# Patient Record
Sex: Male | Born: 1975 | Race: White | Hispanic: No | Smoking: Current every day smoker
Health system: Southern US, Community
[De-identification: ages and names within clinical notes are randomized; demographics above are authoritative.]

## PROBLEM LIST (undated history)

## (undated) ENCOUNTER — Telehealth

## (undated) ENCOUNTER — Encounter

## (undated) ENCOUNTER — Ambulatory Visit

## (undated) ENCOUNTER — Ambulatory Visit: Payer: MEDICARE

## (undated) ENCOUNTER — Encounter: Attending: Ambulatory Care | Primary: Ambulatory Care

## (undated) ENCOUNTER — Encounter: Attending: Family | Primary: Family

## (undated) ENCOUNTER — Encounter: Attending: Pharmacist | Primary: Pharmacist

## (undated) ENCOUNTER — Ambulatory Visit: Attending: Pharmacist | Primary: Pharmacist

## (undated) ENCOUNTER — Ambulatory Visit
Payer: PRIVATE HEALTH INSURANCE | Attending: Physical Medicine & Rehabilitation | Primary: Physical Medicine & Rehabilitation

## (undated) ENCOUNTER — Non-Acute Institutional Stay: Payer: PRIVATE HEALTH INSURANCE

## (undated) ENCOUNTER — Telehealth: Attending: Ambulatory Care | Primary: Ambulatory Care

## (undated) ENCOUNTER — Ambulatory Visit: Payer: PRIVATE HEALTH INSURANCE

## (undated) ENCOUNTER — Ambulatory Visit: Payer: PRIVATE HEALTH INSURANCE | Attending: Family | Primary: Family

## (undated) ENCOUNTER — Telehealth: Attending: Family | Primary: Family

## (undated) DIAGNOSIS — T07XXXA Unspecified multiple injuries, initial encounter: Secondary | ICD-10-CM

## (undated) DIAGNOSIS — M719 Bursopathy, unspecified: Secondary | ICD-10-CM

## (undated) HISTORY — PX: FRACTURE SURGERY: SHX138

## (undated) HISTORY — DX: Unspecified multiple injuries, initial encounter: T07.XXXA

---

## 2015-02-16 ENCOUNTER — Encounter (HOSPITAL_COMMUNITY): Payer: Self-pay | Admitting: *Deleted

## 2015-02-16 ENCOUNTER — Emergency Department (HOSPITAL_COMMUNITY)
Admission: EM | Admit: 2015-02-16 | Discharge: 2015-02-16 | Disposition: A | Discharge status: Home / Self Care | Payer: Self-pay | Attending: Emergency Medicine | Admitting: Emergency Medicine

## 2015-02-16 DIAGNOSIS — M62830 Muscle spasm of back: Secondary | ICD-10-CM | POA: Insufficient documentation

## 2015-02-16 DIAGNOSIS — Z720 Tobacco use: Secondary | ICD-10-CM | POA: Insufficient documentation

## 2015-02-16 MED ORDER — TRAMADOL HCL 50 MG PO TABS: 50.0000 mg | ORAL_TABLET | Freq: Four times a day (QID) | ORAL | Status: AC | PRN

## 2015-02-16 MED ORDER — CYCLOBENZAPRINE HCL 10 MG PO TABS: 10.0000 mg | ORAL_TABLET | Freq: Two times a day (BID) | ORAL | Status: DC | PRN

## 2015-02-16 NOTE — Discharge Instructions (Signed)
Continue to take aleve with the other medications. Do not drive or work while taking the muscle relaxant or narcotic because they will make you sleepy. Follow up with Dr. Romeo AppleHarrison if symptoms persist. Return here as needed.

## 2015-02-16 NOTE — ED Notes (Signed)
Pt co mid and lower back pain with muscle tightness on right side. Denies injury. Pain radiates rt leg.

## 2015-02-16 NOTE — ED Provider Notes (Signed)
CSN: [id number]     Arrival date & time 02/16/15  1551 History   First MD Initiated Contact with Patient 02/16/15 1623     Chief Complaint  Patient presents with  . Back Pain     (Consider location/radiation/quality/duration/timing/severity/associated sxs/prior Treatment) Patient is a 39 y.o. male presenting with back pain. The history is provided by the patient.  Back Pain Location:  Lumbar spine Quality: streatching, pressure. Radiates to:  R posterior upper leg Pain severity:  Moderate Pain is:  Worse during the day Onset quality:  Gradual Duration:  5 days Timing:  Constant Progression:  Worsening Chronicity:  New Context: lifting heavy objects and physical stress   Relieved by:  Nothing Worsened by:  Ambulation, bending, twisting and standing Ineffective treatments:  NSAIDs Associated symptoms: no bladder incontinence and no bowel incontinence    Paul Lynch is a 39 y.o. male who presents to the ED with low back pain and mid back pain on the right side that started 5 days ago. 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 He has not had any direct blow to the back.    History reviewed. No pertinent past medical history. History reviewed. No pertinent past surgical history. History reviewed. No pertinent family history. History  Substance Use Topics  . Smoking status: Current Every Day Smoker -- 0.50 packs/day    Types: Cigarettes  . Smokeless tobacco: Not on file  . Alcohol Use: Yes    Review of Systems  Gastrointestinal: Negative for bowel incontinence.  Genitourinary: Negative for bladder incontinence.  Musculoskeletal: Positive for back pain.  all other systems negative     Allergies  Review of patient's allergies indicates no known allergies.  Home Medications   Prior to Admission medications   Medication Sig Start Date End Date Taking? Authorizing Provider  cyclobenzaprine (FLEXERIL) 10 MG  tablet Take 1 tablet (10 mg total) by mouth 2 (two) times daily as needed for muscle spasms. 02/16/15   Tegh Franek Orlene OchM Aniston Christman, NP  traMADol (ULTRAM) 50 MG tablet Take 1 tablet (50 mg total) by mouth every 6 (six) hours as needed. 02/16/15   Mosie Angus Orlene OchM Macallister Ashmead, NP   BP 125/73 mmHg  Pulse 99  Temp(Src) 98.3 F (36.8 C) (Oral)  Resp 18  Ht 5\' 8"  (1.727 m)  Wt 190 lb (86.183 kg)  BMI 28.90 kg/m2  SpO2 98% Physical Exam  Constitutional: He is oriented to person, place, and time. He appears well-developed and well-nourished. No distress.  HENT:  Head: Normocephalic and atraumatic.  Eyes: EOM are normal. Pupils are equal, round, and reactive to light.  Neck: Normal range of motion. Neck supple.  Cardiovascular: Normal rate and regular rhythm.   Pulmonary/Chest: Effort normal. No respiratory distress. He has no wheezes. He has no rales.  Abdominal: Soft. Bowel sounds are normal. There is no tenderness.  Musculoskeletal: Normal range of motion. He exhibits no edema.       Lumbar back: He exhibits tenderness and spasm. He exhibits normal range of motion, no deformity and normal pulse.  There is tenderness to the thoracic area and the lumbar area on palpation and range of motion, spasm noted.   Neurological: He is alert and oriented to person, place, and time. He has normal strength. No cranial nerve deficit. Gait normal.  Reflex Scores:      Bicep reflexes are 2+ on the right side and 2+ on the left side.  Brachioradialis reflexes are 2+ on the right side and 2+ on the left side.      Patellar reflexes are 2+ on the right side and 2+ on the left side.      Achilles reflexes are 2+ on the right side and 2+ on the left side. Pedal pulses 2+, adequate circulation, ambulatory without foot drag.   Skin: Skin is warm and dry.  Psychiatric: He has a normal mood and affect. His behavior is normal.  Nursing note and vitals reviewed.   ED Course  Procedures  MDM  39 y.o. male with back pain and muscle spasm  stable for d/c without focal neuro deficits. Discussed with the patient clinical findings and plan of care. All questioned fully answered. He will follow up with ortho if symptoms persist or return here if any problems arise.   Final diagnoses:  Muscle spasm of back      [facility], NP 02/16/15 1709  Donnetta Hutching, MD 02/16/15 2321

## 2015-02-16 NOTE — ED Notes (Signed)
Pt made aware to return if symptoms worsen or if any life threatening symptoms occur.   

## 2015-02-19 ENCOUNTER — Encounter (HOSPITAL_COMMUNITY): Payer: Self-pay | Admitting: Emergency Medicine

## 2015-02-19 ENCOUNTER — Emergency Department (HOSPITAL_COMMUNITY)
Admission: EM | Admit: 2015-02-19 | Discharge: 2015-02-19 | Disposition: A | Discharge status: Home / Self Care | Payer: Self-pay | Attending: Emergency Medicine | Admitting: Emergency Medicine

## 2015-02-19 DIAGNOSIS — M62830 Muscle spasm of back: Secondary | ICD-10-CM | POA: Insufficient documentation

## 2015-02-19 DIAGNOSIS — M549 Dorsalgia, unspecified: Secondary | ICD-10-CM

## 2015-02-19 DIAGNOSIS — Z720 Tobacco use: Secondary | ICD-10-CM | POA: Insufficient documentation

## 2015-02-19 NOTE — Discharge Instructions (Signed)
Your vital signs are nonacute at this time. Your examination is consistent with muscle spasm and back pain. Please see the orthopedic specialist listed above, or your chiropractic specialist that she was seen in the past for additional evaluation and management. Please return to the emergency department if any emergent changes, problems, or concerns. Musculoskeletal Pain Musculoskeletal pain is muscle and boney aches and pains. These pains can occur in any part of the body. Your caregiver may treat you without knowing the cause of the pain. They may treat you if blood or urine tests, X-rays, and other tests were normal.  CAUSES There is often not a definite cause or reason for these pains. These pains may be caused by a type of germ (virus). The discomfort may also come from overuse. Overuse includes working out too hard when your body is not fit. Boney aches also come from weather changes. Bone is sensitive to atmospheric pressure changes. HOME CARE INSTRUCTIONS   Ask when your test results will be ready. Make sure you get your test results.  Only take over-the-counter or prescription medicines for pain, discomfort, or fever as directed by your caregiver. If you were given medications for your condition, do not drive, operate machinery or power tools, or sign legal documents for 24 hours. Do not drink alcohol. Do not take sleeping pills or other medications that may interfere with treatment.  Continue all activities unless the activities cause more pain. When the pain lessens, slowly resume normal activities. Gradually increase the intensity and duration of the activities or exercise.  During periods of severe pain, bed rest may be helpful. Lay or sit in any position that is comfortable.  Putting ice on the injured area.  Put ice in a bag.  Place a towel between your skin and the bag.  Leave the ice on for 15 to 20 minutes, 3 to 4 times a day.  Follow up with your caregiver for continued  problems and no reason can be found for the pain. If the pain becomes worse or does not go away, it may be necessary to repeat tests or do additional testing. Your caregiver may need to look further for a possible cause. SEEK IMMEDIATE MEDICAL CARE IF:  You have pain that is getting worse and is not relieved by medications.  You develop chest pain that is associated with shortness or breath, sweating, feeling sick to your stomach (nauseous), or throw up (vomit).  Your pain becomes localized to the abdomen.  You develop any new symptoms that seem different or that concern you. MAKE SURE YOU:   Understand these instructions.  Will watch your condition.  Will get help right away if you are not doing well or get worse. Document Released: 09/08/2005 Document Revised: 12/01/2011 Document Reviewed: 05/13/2013 [facility] Patient Information 2015 [place], [place]. This information is not intended to replace advice given to you by your health care provider. Make sure you discuss any questions you have with your health care provider.

## 2015-02-19 NOTE — ED Notes (Signed)
Pt states that he continues to have back pain since his visit on Friday.  States that he did not get the rx for muscle relaxers or pain meds filled and has been taking ibuprofen without relief.

## 2015-02-19 NOTE — ED Provider Notes (Signed)
CSN: [id number]     Arrival date & time 02/19/15  1242 History  This chart was scribed for non-physician practitioner, Ivery QualeHobson Laniya Friedl, PA-C working with Samuel JesterKathleen McManus, DO by Gwenyth Oberatherine Macek, ED scribe. This patient was seen in room APFT23/APFT23 and the patient's care was started at 1:46 PM   Chief Complaint  Patient presents with  . Back Pain   Patient is a 39 y.o. male presenting with back pain. The history is provided by the patient. No language interpreter was used.  Back Pain Location:  Thoracic spine, lumbar spine and gluteal region Pain severity:  Moderate Worse during: worse in the morning. Onset quality:  Gradual Duration:  1 week Timing:  Constant Progression:  Unchanged Chronicity:  New Context: not recent injury   Relieved by:  Nothing Worsened by:  Lying down Ineffective treatments:  Ibuprofen, NSAIDs and cold packs Associated symptoms: no numbness    HPI Comments: Paul Lynch is a 39 y.o. male who presents to the Emergency Department complaining of constant, moderate, jabbing lower and middle back pain that started 1 week ago. He reports swelling of his back as an associated symptom. Pt states pain is worst in the morning and is exacerbated by lying down. He has tried Ibuprofen, Aleve and ice with no relief. Pt was seen in the ED on 5/27 for the same and was diagnosed with a muscle spasm. He was prescribed Ultram and Flexeril, but has not filled medications because of financial difficulties. Pt has a history of a bulging disk which was treated by a chiropractor. He also notes that he does a lot of heavy lifting at work. Pt denies recent injuries. He also denies numbness as an associated symptom.  History reviewed. No pertinent past medical history. Past Surgical History  Procedure Laterality Date  . Fracture surgery     History reviewed. No pertinent family history. History  Substance Use Topics  . Smoking status: Current Every Day Smoker -- 0.50 packs/day    Types:  Cigarettes  . Smokeless tobacco: Not on file  . Alcohol Use: Yes     Comment: occasional    Review of Systems  Musculoskeletal: Positive for back pain.  Neurological: Negative for numbness.  All other systems reviewed and are negative.  Allergies  Review of patient's allergies indicates no known allergies.  Home Medications   Prior to Admission medications   Medication Sig Start Date End Date Taking? Authorizing Provider  cyclobenzaprine (FLEXERIL) 10 MG tablet Take 1 tablet (10 mg total) by mouth 2 (two) times daily as needed for muscle spasms. 02/16/15   Hope Orlene OchM Neese, NP  traMADol (ULTRAM) 50 MG tablet Take 1 tablet (50 mg total) by mouth every 6 (six) hours as needed. 02/16/15   Hope Orlene OchM Neese, NP   BP 127/73 mmHg  Pulse 103  Temp(Src) 98.6 F (37 C) (Oral)  Resp 20  Ht 5\' 8"  (1.727 m)  Wt 180 lb (81.647 kg)  BMI 27.38 kg/m2  SpO2 99% Physical Exam  Constitutional: He appears well-developed and well-nourished. No distress.  HENT:  Head: Normocephalic and atraumatic.  Eyes: Conjunctivae and EOM are normal.  Neck: Neck supple. No tracheal deviation present.  Cardiovascular: Normal rate, regular rhythm and normal heart sounds.   Pulmonary/Chest: Effort normal and breath sounds normal. No respiratory distress.  No bruit  Musculoskeletal:       Thoracic back: He exhibits tenderness and spasm. He exhibits no deformity.       Lumbar back: He exhibits spasm. He  exhibits no deformity.  Neurological: Gait normal.  No foot drop; no muscle or motor deficits of the LE  Skin: Skin is warm and dry.  Psychiatric: He has a normal mood and affect. His behavior is normal.  Nursing note and vitals reviewed.   ED Course  Procedures   DIAGNOSTIC STUDIES: Oxygen Saturation is 99% on RA, normal by my interpretation.    COORDINATION OF CARE: 1:58 PM Discussed treatment plan with pt at bedside and pt agreed to plan. Will refer to orthopedist for continued pain.   Labs Review Labs  Reviewed - No data to display  Imaging Review No results found.   EKG Interpretation None      MDM  Vital signs are nonacute today. Patient was seen in the emergency department 3 or 4 days ago with similar problem. He presents to the emergency department today because he had pain getting out of bed, and he states he felt as though that there was something sticking in his back and keeping him from moving this morning when he attempted to go to work. There was no loss of bowel or bladder function.  Examination today is negative for any caudal equina. There is palpable spasm of the back multiple areas. There no gross neurologic deficits appreciated. There no hot areas consistent with any infection or discitis.  I discussed with the patient the need for orthopedic evaluation. The patient states that he had excellent result with a previous disc related problems from a chiropractic physician, and I have encouraged him to see the chiropractic physician if he does not see the orthopedic specialist. The patient states that he does not like to use narcotic medications and that is why he did not get his Flexeril or his Ultram filled, as well as a financial situation going on. The patient elects to use the ibuprofen and Tylenol. He states he will also alternate heat and ice. The patient is encouraged to see the specialist as listed above. He is also encouraged to return if any emergent changes, problems, or concerns.    Final diagnoses:  None    **I have reviewed nursing notes, vital signs, and all appropriate lab and imaging results for this patient.*  **I personally performed the services described in this documentation, which was scribed in my presence. The recorded information has been reviewed and is accurate.Ivery Quale, PA-C 02/19/15 1412  Samuel Jester, DO 02/22/15 2008

## 2015-04-14 DIAGNOSIS — Y288XXA Contact with other sharp object, undetermined intent, initial encounter: Secondary | ICD-10-CM | POA: Insufficient documentation

## 2015-04-14 DIAGNOSIS — Z720 Tobacco use: Secondary | ICD-10-CM | POA: Insufficient documentation

## 2015-04-14 DIAGNOSIS — S63501A Unspecified sprain of right wrist, initial encounter: Secondary | ICD-10-CM | POA: Insufficient documentation

## 2015-04-14 DIAGNOSIS — S61210A Laceration without foreign body of right index finger without damage to nail, initial encounter: Secondary | ICD-10-CM | POA: Insufficient documentation

## 2015-04-14 DIAGNOSIS — S53401A Unspecified sprain of right elbow, initial encounter: Secondary | ICD-10-CM | POA: Insufficient documentation

## 2015-04-14 DIAGNOSIS — Y9389 Activity, other specified: Secondary | ICD-10-CM | POA: Insufficient documentation

## 2015-04-14 DIAGNOSIS — Y998 Other external cause status: Secondary | ICD-10-CM | POA: Insufficient documentation

## 2015-04-14 DIAGNOSIS — Y9289 Other specified places as the place of occurrence of the external cause: Secondary | ICD-10-CM | POA: Insufficient documentation

## 2015-04-15 ENCOUNTER — Encounter (HOSPITAL_COMMUNITY): Payer: Self-pay | Admitting: Emergency Medicine

## 2015-04-15 ENCOUNTER — Emergency Department (HOSPITAL_COMMUNITY): Payer: Self-pay

## 2015-04-15 ENCOUNTER — Emergency Department (HOSPITAL_COMMUNITY)
Admission: EM | Admit: 2015-04-15 | Discharge: 2015-04-15 | Disposition: A | Discharge status: Home / Self Care | Payer: Self-pay | Attending: Emergency Medicine | Admitting: Emergency Medicine

## 2015-04-15 DIAGNOSIS — R52 Pain, unspecified: Secondary | ICD-10-CM

## 2015-04-15 DIAGNOSIS — S63501A Unspecified sprain of right wrist, initial encounter: Secondary | ICD-10-CM

## 2015-04-15 DIAGNOSIS — S61218A Laceration without foreign body of other finger without damage to nail, initial encounter: Secondary | ICD-10-CM

## 2015-04-15 DIAGNOSIS — S53401A Unspecified sprain of right elbow, initial encounter: Secondary | ICD-10-CM

## 2015-04-15 MED ORDER — KETOROLAC TROMETHAMINE 10 MG PO TABS
10.0000 mg | ORAL_TABLET | Freq: Once | ORAL | Status: AC
  Administered 2015-04-15: 10 mg via ORAL
  Filled 2015-04-15: qty 1

## 2015-04-15 NOTE — ED Notes (Signed)
Patient reports his hand was caught in a slide this evening and twisted his arm behind him. complaining of pain to right elbow and right thumb. Also reports swelling to right wrist, but denies pain.

## 2015-04-15 NOTE — ED Notes (Signed)
Patient verbalizes understanding of discharge instructions, home care and follow up care. Patient ambulatory out of department at this time. 

## 2015-04-15 NOTE — ED Provider Notes (Signed)
CSN: [id number]     Arrival date & time 04/14/15  2349 History   First MD Initiated Contact with Patient 04/15/15 0043     Chief Complaint  Patient presents with  . Hand Injury     (Consider location/radiation/quality/duration/timing/severity/associated sxs/prior Treatment) Patient is a 39 y.o. male presenting with hand injury. The history is provided by the patient.  Hand Injury Location:  Elbow, finger and wrist Time since incident:  5 hours Injury: yes   Mechanism of injury comment:  Pt got had  his arm stuck in a water slide Elbow location:  R elbow Wrist location:  R wrist Finger location:  R thumb Pain details:    Quality:  Aching   Severity:  Moderate   Onset quality:  Sudden   Duration:  5 hours   Timing:  Intermittent   Progression:  Worsening Chronicity:  New Handedness:  Right-handed Dislocation: no   Foreign body present:  No foreign bodies Prior injury to area:  Yes Worsened by:  Nothing tried Ineffective treatments:  Rest and being still Associated symptoms: decreased range of motion   Associated symptoms: no back pain and no neck pain     History reviewed. No pertinent past medical history. Past Surgical History  Procedure Laterality Date  . Fracture surgery     History reviewed. No pertinent family history. History  Substance Use Topics  . Smoking status: Current Every Day Smoker -- 0.50 packs/day    Types: Cigarettes  . Smokeless tobacco: Not on file  . Alcohol Use: Yes     Comment: occasional    Review of Systems  Constitutional: Negative for activity change.       All ROS Neg except as noted in HPI  HENT: Negative for nosebleeds.   Eyes: Negative for photophobia and discharge.  Respiratory: Negative for cough, shortness of breath and wheezing.   Cardiovascular: Negative for chest pain and palpitations.  Gastrointestinal: Negative for abdominal pain and blood in stool.  Genitourinary: Negative for dysuria, frequency and hematuria.   Musculoskeletal: Negative for back pain, arthralgias and neck pain.  Skin: Negative.   Neurological: Negative for dizziness, seizures and speech difficulty.  Psychiatric/Behavioral: Negative for hallucinations and confusion.      Allergies  Review of patient's allergies indicates no known allergies.  Home Medications   Prior to Admission medications   Medication Sig Start Date End Date Taking? Authorizing Provider  cyclobenzaprine (FLEXERIL) 10 MG tablet Take 1 tablet (10 mg total) by mouth 2 (two) times daily as needed for muscle spasms. Patient not taking: Reported on 02/19/2015 02/16/15   Janne Napoleon, NP  traMADol (ULTRAM) 50 MG tablet Take 1 tablet (50 mg total) by mouth every 6 (six) hours as needed. Patient not taking: Reported on 02/19/2015 02/16/15   Janne Napoleon, NP   BP 135/76 mmHg  Pulse 82  Temp(Src) 99 F (37.2 C) (Oral)  Resp 18  Ht  (1.702 m)  Wt 176 lb (79.833 kg)  BMI 27.56 kg/m2  SpO2 100% Physical Exam  Constitutional: He is oriented to person, place, and time. He appears well-developed and well-nourished.  Non-toxic appearance.  HENT:  Head: Normocephalic.  Right Ear: Tympanic membrane and external ear normal.  Left Ear: Tympanic membrane and external ear normal.  Eyes: EOM and lids are normal. Pupils are equal, round, and reactive to light.  Neck: Normal range of motion. Neck supple. Carotid bruit is not present.  Cardiovascular: Normal rate, regular rhythm, normal heart sounds, intact  distal pulses and normal pulses.   Pulmonary/Chest: Breath sounds normal. No respiratory distress.  Abdominal: Soft. Bowel sounds are normal. There is no tenderness. There is no guarding.  Musculoskeletal: He exhibits tenderness.  There is a laceration that has begun to seal on the lateral and medial aspect of the right index finger. There is full range of motion of the right index finger. There is pain at the DIP joint of the right thumb. Mild swelling present. Is good  range of motion of the thumb. Patient can touch each finger with his thumb.  There is no deformity of the wrist. There is no pain in the anatomical snuffbox. There is no deformity of the radius or ulnar. There is pain to the olecranon process of the right elbow, there is pain at the lower triceps area of the right elbow. There is soreness of the bicep area. There is no palpable hematoma or deformity. There is full range of motion of the right shoulder. No evidence of any dislocation. No tenderness over the acromioclavicular joint.  Lymphadenopathy:       Head (right side): No submandibular adenopathy present.       Head (left side): No submandibular adenopathy present.    He has no cervical adenopathy.  Neurological: He is alert and oriented to person, place, and time. He has normal strength. No cranial nerve deficit or sensory deficit.  Skin: Skin is warm and dry.  Psychiatric: He has a normal mood and affect. His speech is normal.  Nursing note and vitals reviewed.   ED Course  Procedures (including critical care time) Labs Review Labs Reviewed - No data to display  Imaging Review No results found.   EKG Interpretation None      MDM  Vital signs reviewed.  Xray of the right wrist  And elbow are negative for fracture or dislocation. Xray of the right thumb is negative for fx or dislocation. Pt fitted with sling. Pt refused wrist splint. Suspect shoulder strain and finger sprain. Pt to see orthopedics for eval if not improving.   Final diagnoses:  Pain    **I have reviewed nursing notes, vital signs, and all appropriate lab and imaging results for this patient.Paul Quale, PA-C 04/16/15 1657  Devoria Albe, MD 04/19/15 [phone]

## 2015-04-15 NOTE — Discharge Instructions (Signed)
The x-rays of your elbow, wrist, and thumb are all negative for fracture, or dislocation. No evidence at this time of a major lead or tear in the upper extremity. Please use the sling for the next 4 or 5 days. Please use Tylenol or ibuprofen for soreness. Please use your wrist splint for the next 4 or 5 days. Please see Dr. Romeo Apple for additional evaluation and management if not improving. Wrist Pain Wrist injuries are frequent in adults and children. A sprain is an injury to the ligaments that hold your bones together. A strain is an injury to muscle or muscle cord-like structures (tendons) from stretching or pulling. Generally, when wrists are moderately tender to touch following a fall or injury, a break in the bone (fracture) may be present. Most wrist sprains or strains are better in 3 to 5 days, but complete healing may take several weeks. HOME CARE INSTRUCTIONS   Put ice on the injured area.  Put ice in a plastic bag.  Place a towel between your skin and the bag.  Leave the ice on for 15-20 minutes, 3-4 times a day, for the first 2 days, or as directed by your health care provider.  Keep your arm raised above the level of your heart whenever possible to reduce swelling and pain.  Rest the injured area for at least 48 hours or as directed by your health care provider.  If a splint or elastic bandage has been applied, use it for as long as directed by your health care provider or until seen by a health care provider for a follow-up exam.  Only take over-the-counter or prescription medicines for pain, discomfort, or fever as directed by your health care provider.  Keep all follow-up appointments. You may need to follow up with a specialist or have follow-up X-rays. Improvement in pain level is not a guarantee that you did not fracture a bone in your wrist. The only way to determine whether or not you have a broken bone is by X-ray. SEEK IMMEDIATE MEDICAL CARE IF:   Your fingers are  swollen, very red, white, or cold and blue.  Your fingers are numb or tingling.  You have increasing pain.  You have difficulty moving your fingers. MAKE SURE YOU:   Understand these instructions.  Will watch your condition.  Will get help right away if you are not doing well or get worse. Document Released: 06/18/2005 Document Revised: 09/13/2013 Document Reviewed: 10/30/2010 [facility] Patient Information 2015 [place], [place]. This information is not intended to replace advice given to you by your health care provider. Make sure you discuss any questions you have with your health care provider.

## 2015-09-27 ENCOUNTER — Encounter (HOSPITAL_COMMUNITY): Payer: Self-pay

## 2015-09-27 ENCOUNTER — Emergency Department (HOSPITAL_COMMUNITY)
Admission: EM | Admit: 2015-09-27 | Discharge: 2015-09-27 | Disposition: A | Discharge status: Home / Self Care | Payer: Self-pay | Attending: Emergency Medicine | Admitting: Emergency Medicine

## 2015-09-27 DIAGNOSIS — J019 Acute sinusitis, unspecified: Secondary | ICD-10-CM | POA: Insufficient documentation

## 2015-09-27 DIAGNOSIS — F17210 Nicotine dependence, cigarettes, uncomplicated: Secondary | ICD-10-CM | POA: Insufficient documentation

## 2015-09-27 MED ORDER — AMOXICILLIN 500 MG PO CAPS: 500.0000 mg | ORAL_CAPSULE | Freq: Three times a day (TID) | ORAL | Status: AC

## 2015-09-27 NOTE — Discharge Instructions (Signed)
Sinusitis, Adult  Sinusitis is redness, soreness, and puffiness (inflammation) of the air pockets in the bones of your face (sinuses). The redness, soreness, and puffiness can cause air and mucus to get trapped in your sinuses. This can allow germs to grow and cause an infection.   HOME CARE    Drink enough fluids to keep your pee (urine) clear or pale yellow.   Use a humidifier in your home.   Run a hot shower to create steam in the bathroom. Sit in the bathroom with the door closed. Breathe in the steam 3-4 times a day.   Put a warm, moist washcloth on your face 3-4 times a day, or as told by your doctor.   Use salt water sprays (saline sprays) to wet the thick fluid in your nose. This can help the sinuses drain.   Only take medicine as told by your doctor.  GET HELP RIGHT AWAY IF:    Your pain gets worse.   You have very bad headaches.   You are sick to your stomach (nauseous).   You throw up (vomit).   You are very sleepy (drowsy) all the time.   Your face is puffy (swollen).   Your vision changes.   You have a stiff neck.   You have trouble breathing.  MAKE SURE YOU:    Understand these instructions.   Will watch your condition.   Will get help right away if you are not doing well or get worse.     This information is not intended to replace advice given to you by your health care provider. Make sure you discuss any questions you have with your health care provider.     Document Released: 02/25/2008 Document Revised: 09/29/2014 Document Reviewed: 04/13/2012  Elsevier Interactive Patient Education 2016 [employer]

## 2015-09-27 NOTE — ED Notes (Signed)
Pt reports runny nose for the past 2 days and this morning woke up with severe sinus pressure.

## 2015-09-30 NOTE — ED Provider Notes (Signed)
CSN: [id number]     Arrival date & time 09/27/15  1725 History   First MD Initiated Contact with Patient 09/27/15 1834     Chief Complaint  Patient presents with  . Facial Pain     (Consider location/radiation/quality/duration/timing/severity/associated sxs/prior Treatment) HPI  Paul Lynch is a 40 y.o. male who presents to the Emergency Department complaining of runny nose and facial pressure and pain for 2 days.  He reports increased pain behind his eyes and across the forehead.  Pain increases with bending over.  He states he has been blowing colored mucus from his nose.  He states his symptoms feel similar to previous sinus infections.  He has used OTC cold medications without relief.     History reviewed. No pertinent past medical history. Past Surgical History  Procedure Laterality Date  . Fracture surgery     No family history on file. Social History  Substance Use Topics  . Smoking status: Current Every Day Smoker -- 0.50 packs/day    Types: Cigarettes  . Smokeless tobacco: None  . Alcohol Use: Yes     Comment: occasional    Review of Systems  Constitutional: Negative for fever, chills, activity change and appetite change.  HENT: Positive for congestion, rhinorrhea and sinus pressure. Negative for facial swelling, sore throat and trouble swallowing.   Eyes: Negative for visual disturbance.  Respiratory: Positive for cough. Negative for shortness of breath, wheezing and stridor.   Gastrointestinal: Negative for nausea, vomiting and abdominal pain.  Musculoskeletal: Negative for neck pain and neck stiffness.  Skin: Negative.  Negative for rash.  Neurological: Positive for headaches. Negative for dizziness, weakness and numbness.  Hematological: Negative for adenopathy.  Psychiatric/Behavioral: Negative for confusion.  All other systems reviewed and are negative.     Allergies  Review of patient's allergies indicates no known allergies.  Home Medications   Prior  to Admission medications   Medication Sig Start Date End Date Taking? Authorizing Provider  amoxicillin (AMOXIL) 500 MG capsule Take 1 capsule (500 mg total) by mouth 3 (three) times daily. For 10 days 09/27/15   Melek Pownall, PA-C  cyclobenzaprine (FLEXERIL) 10 MG tablet Take 1 tablet (10 mg total) by mouth 2 (two) times daily as needed for muscle spasms. Patient not taking: Reported on 02/19/2015 02/16/15   Janne Napoleon, NP  traMADol (ULTRAM) 50 MG tablet Take 1 tablet (50 mg total) by mouth every 6 (six) hours as needed. Patient not taking: Reported on 02/19/2015 02/16/15   Janne Napoleon, NP   BP 135/72 mmHg  Pulse 89  Temp(Src) 98.7 F (37.1 C) (Oral)  Resp 14  SpO2 99% Physical Exam  Constitutional: He is oriented to person, place, and time. He appears well-developed and well-nourished. No distress.  HENT:  Head: Normocephalic and atraumatic.  Right Ear: Tympanic membrane and ear canal normal.  Left Ear: Tympanic membrane and ear canal normal.  Nose: Right sinus exhibits maxillary sinus tenderness. Left sinus exhibits maxillary sinus tenderness.  Mouth/Throat: Uvula is midline, oropharynx is clear and moist and mucous membranes are normal.  Eyes: Conjunctivae and EOM are normal. Pupils are equal, round, and reactive to light.  Neck: Normal range of motion. Neck supple.  Cardiovascular: Normal rate and regular rhythm.   Pulmonary/Chest: Effort normal and breath sounds normal. No respiratory distress.  Musculoskeletal: Normal range of motion.  Neurological: He is alert and oriented to person, place, and time.  Skin: Skin is warm and dry. No rash noted.  Psychiatric: He  has a normal mood and affect.  Nursing note and vitals reviewed.   ED Course  Procedures (including critical care time) Labs Review Labs Reviewed - No data to display  Imaging Review No results found. I have personally reviewed and evaluated these images and lab results as part of my medical decision-making.    EKG Interpretation None      MDM   Final diagnoses:  Acute sinusitis, recurrence not specified, unspecified location    Pt non-toxic appearing.  Vitals stable.  Facial pain likely related to sinusitis.  No dental pain or facial swelling.  Airway patent.  Pt also advised to use OTC afrin spray, tylenol and motrin if needed.  He agrees to close PMD f/u.      Pauline Ausammy Phoenix Dresser, PA-C 09/30/15 1905  Vanetta MuldersScott Zackowski, MD 10/03/15 [phone]

## 2015-12-25 ENCOUNTER — Emergency Department (HOSPITAL_COMMUNITY)
Admission: EM | Admit: 2015-12-25 | Discharge: 2015-12-25 | Disposition: A | Discharge status: Home / Self Care | Payer: Self-pay | Attending: Emergency Medicine | Admitting: Emergency Medicine

## 2015-12-25 ENCOUNTER — Encounter (HOSPITAL_COMMUNITY): Payer: Self-pay | Admitting: Emergency Medicine

## 2015-12-25 DIAGNOSIS — F17210 Nicotine dependence, cigarettes, uncomplicated: Secondary | ICD-10-CM | POA: Insufficient documentation

## 2015-12-25 DIAGNOSIS — Z202 Contact with and (suspected) exposure to infections with a predominantly sexual mode of transmission: Secondary | ICD-10-CM

## 2015-12-25 DIAGNOSIS — A64 Unspecified sexually transmitted disease: Secondary | ICD-10-CM | POA: Insufficient documentation

## 2015-12-25 LAB — URINALYSIS, ROUTINE W REFLEX MICROSCOPIC
Bilirubin Urine: NEGATIVE
GLUCOSE, UA: NEGATIVE mg/dL
Hgb urine dipstick: NEGATIVE
KETONES UR: NEGATIVE mg/dL
Leukocytes, UA: NEGATIVE
Nitrite: NEGATIVE
PROTEIN: NEGATIVE mg/dL
Specific Gravity, Urine: 1.02 (ref 1.005–1.030)
pH: 6 (ref 5.0–8.0)

## 2015-12-25 NOTE — Discharge Instructions (Signed)
Someone from the flow managers office will inform you if any of your test are abnormal. Your vital signs are nonacute. It is important that you exercise adequate time protection when pertussis abating and sexual activity. Safe Sex Safe sex is about reducing the risk of giving or getting a sexually transmitted disease (STD). STDs are spread through sexual contact involving the genitals, mouth, or rectum. Some STDs can be cured and others cannot. Safe sex can also prevent unintended pregnancies.  WHAT ARE SOME SAFE SEX PRACTICES?  Limit your sexual activity to only one partner who is having sex with only you.  Talk to your partner about his or her past partners, past STDs, and drug use.  Use a condom every time you have sexual intercourse. This includes vaginal, oral, and anal sexual activity. Both females and males should wear condoms during oral sex. Only use latex or polyurethane condoms and water-based lubricants. Using petroleum-based lubricants or oils to lubricate a condom will weaken the condom and increase the chance that it will break. The condom should be in place from the beginning to the end of sexual activity. Wearing a condom reduces, but does not completely eliminate, your risk of getting or giving an STD. STDs can be spread by contact with infected body fluids and skin.  Get vaccinated for hepatitis B and HPV.  Avoid alcohol and recreational drugs, which can affect your judgment. You may forget to use a condom or participate in high-risk sex.  For females, avoid douching after sexual intercourse. Douching can spread an infection farther into the reproductive tract.  Check your body for signs of sores, blisters, rashes, or unusual discharge. See your health care provider if you notice any of these signs.  Avoid sexual contact if you have symptoms of an infection or are being treated for an STD. If you or your partner has herpes, avoid sexual contact when blisters are present. Use  condoms at all other times.  If you are at risk of being infected with HIV, it is recommended that you take a prescription medicine daily to prevent HIV infection. This is called pre-exposure prophylaxis (PrEP). You are considered at risk if:  You are a man who has sex with other men (MSM).  You are a heterosexual man or woman who is sexually active with more than one partner.  You take drugs by injection.  You are sexually active with a partner who has HIV.  Talk with your health care provider about whether you are at high risk of being infected with HIV. If you choose to begin PrEP, you should first be tested for HIV. You should then be tested every 3 months for as long as you are taking PrEP.  See your health care provider for regular screenings, exams, and tests for other STDs. Before having sex with a new partner, each of you should be screened for STDs and should talk about the results with each other. WHAT ARE THE BENEFITS OF SAFE SEX?   There is less chance of getting or giving an STD.  You can prevent unwanted or unintended pregnancies.  By discussing safe sex concerns with your partner, you may increase feelings of intimacy, comfort, trust, and honesty between the two of you.   This information is not intended to replace advice given to you by your health care provider. Make sure you discuss any questions you have with your health care provider.   Document Released: 10/16/2004 Document Revised: 09/29/2014 Document Reviewed: 03/01/2012 Elsevier Interactive  Patient Education 2016 [employer].

## 2015-12-25 NOTE — ED Provider Notes (Signed)
CSN: [id number]     Arrival date & time 12/25/15  1804 History   First MD Initiated Contact with Patient 12/25/15 1825     Chief Complaint  Patient presents with  . SEXUALLY TRANSMITTED DISEASE     (Consider location/radiation/quality/duration/timing/severity/associated sxs/prior Treatment) HPI Comments: Patient is a 40 year old male who presents to the emergency department with a complaint of exposure to sexually transmitted disease. The patient states that about 3 weeks ago he had unprotected intercourse with a lady. He states that since that time the sexual partner has called him to say that she has been found positive for some sexually transmitted diseases. The patient states he has not had any symptoms. In particular he has not had any dysuria. He's not had any unusual rash. He's not had any fever reported. He states that he is quite anxious, and sometimes he has itching related to anxiety, but no other problems reported. He denies any pain of the penis, scrotum, or peritoneum. He presents now for evaluation of following contact with possible sexual transmitted disease.  The history is provided by the patient.    History reviewed. No pertinent past medical history. Past Surgical History  Procedure Laterality Date  . Fracture surgery     History reviewed. No pertinent family history. Social History  Substance Use Topics  . Smoking status: Current Every Day Smoker -- 0.50 packs/day    Types: Cigarettes  . Smokeless tobacco: None  . Alcohol Use: Yes     Comment: occasional    Review of Systems  Constitutional: Negative for activity change.       All ROS Neg except as noted in HPI  HENT: Negative for nosebleeds.   Eyes: Negative for photophobia and discharge.  Respiratory: Negative for cough, shortness of breath and wheezing.   Cardiovascular: Negative for chest pain and palpitations.  Gastrointestinal: Negative for abdominal pain and blood in stool.  Genitourinary: Negative for  dysuria, frequency, hematuria, discharge, penile swelling, difficulty urinating, genital sores and testicular pain.  Musculoskeletal: Negative for back pain, arthralgias and neck pain.  Skin: Negative.   Neurological: Negative for dizziness, seizures and speech difficulty.  Psychiatric/Behavioral: Negative for hallucinations and confusion.      Allergies  Review of patient's allergies indicates no known allergies.  Home Medications   Prior to Admission medications   Medication Sig Start Date End Date Taking? Authorizing Provider  amoxicillin (AMOXIL) 500 MG capsule Take 1 capsule (500 mg total) by mouth 3 (three) times daily. For 10 days 09/27/15   Tammy Triplett, PA-C  cyclobenzaprine (FLEXERIL) 10 MG tablet Take 1 tablet (10 mg total) by mouth 2 (two) times daily as needed for muscle spasms. Patient not taking: Reported on 02/19/2015 02/16/15   Janne NapoleonHope M Neese, NP  traMADol (ULTRAM) 50 MG tablet Take 1 tablet (50 mg total) by mouth every 6 (six) hours as needed. Patient not taking: Reported on 02/19/2015 02/16/15   Janne NapoleonHope M Neese, NP   BP 145/76 mmHg  Pulse 111  Temp(Src) 98.5 F (36.9 C) (Oral)  Resp 18  Ht 5\' 8"  (1.727 m)  Wt 81.647 kg  BMI 27.38 kg/m2  SpO2 98% Physical Exam  Constitutional: He is oriented to person, place, and time. He appears well-developed and well-nourished.  Non-toxic appearance.  HENT:  Head: Normocephalic.  Right Ear: Tympanic membrane and external ear normal.  Left Ear: Tympanic membrane and external ear normal.  Eyes: EOM and lids are normal. Pupils are equal, round, and reactive to light.  Neck:  Normal range of motion. Neck supple. Carotid bruit is not present.  Cardiovascular: Normal rate, regular rhythm, normal heart sounds, intact distal pulses and normal pulses.   Pulmonary/Chest: Breath sounds normal. No respiratory distress.  Abdominal: Soft. Bowel sounds are normal. There is no tenderness. There is no guarding.  Genitourinary: Right testis shows  no mass, no swelling and no tenderness. Right testis is descended. Left testis shows no mass, no swelling and no tenderness. Left testis is descended. Circumcised. No hypospadias, penile erythema or penile tenderness. No discharge found.  Musculoskeletal: Normal range of motion.  Lymphadenopathy:       Head (right side): No submandibular adenopathy present.       Head (left side): No submandibular adenopathy present.    He has no cervical adenopathy.  Neurological: He is alert and oriented to person, place, and time. He has normal strength. No cranial nerve deficit or sensory deficit.  Skin: Skin is warm and dry.  Psychiatric: He has a normal mood and affect. His speech is normal.  Nursing note and vitals reviewed.   ED Course  Procedures (including critical care time) Labs Review Labs Reviewed  URINALYSIS, ROUTINE W REFLEX MICROSCOPIC (NOT AT [facility])  RPR  HIV ANTIBODY (ROUTINE TESTING)  GC/CHLAMYDIA PROBE AMP ([place]) NOT AT [facility]    Imaging Review No results found. I have personally reviewed and evaluated these images and lab results as part of my medical decision-making.   EKG Interpretation None      MDM  Vital signs reviewed. No acute findings noted on today's examination.  Urinalysis is negative. The patient was tested for RPR, HIV, herpes titer. Patient will follow-up at the health department if any changes or problems.    Final diagnoses:  None    *I have reviewed nursing notes, vital signs, and all appropriate lab and imaging results for this patient.**    Ivery Quale, PA-C 12/25/15 1939  Ivery Quale, PA-C 12/25/15 1940  Linwood Dibbles, MD 12/27/15 [phone]

## 2015-12-25 NOTE — ED Notes (Signed)
Pt reports was recently told that male partner has herpes and is being tested for HIV. Pt reports would like to be tested for sexually transmitted disease and HIV. Pt denies any symptoms/discharge. nad noted.

## 2015-12-26 LAB — RPR: RPR: NONREACTIVE

## 2015-12-26 LAB — HIV ANTIBODY (ROUTINE TESTING W REFLEX): HIV SCREEN 4TH GENERATION: NONREACTIVE

## 2015-12-27 LAB — GC/CHLAMYDIA PROBE AMP (~~LOC~~) NOT AT ARMC
Chlamydia: NEGATIVE
Neisseria Gonorrhea: NEGATIVE

## 2015-12-27 LAB — HSV 2 ANTIBODY, IGG: HSV 2 Glycoprotein G Ab, IgG: <0.91 index (ref 0.00–0.90)

## 2015-12-31 ENCOUNTER — Telehealth (HOSPITAL_BASED_OUTPATIENT_CLINIC_OR_DEPARTMENT_OTHER): Payer: Self-pay | Admitting: Emergency Medicine

## 2016-07-02 ENCOUNTER — Emergency Department (HOSPITAL_COMMUNITY): Payer: Self-pay

## 2016-07-02 ENCOUNTER — Emergency Department (HOSPITAL_COMMUNITY)
Admission: EM | Admit: 2016-07-02 | Discharge: 2016-07-02 | Disposition: A | Discharge status: Home / Self Care | Payer: Self-pay | Attending: Emergency Medicine | Admitting: Emergency Medicine

## 2016-07-02 DIAGNOSIS — R112 Nausea with vomiting, unspecified: Secondary | ICD-10-CM | POA: Insufficient documentation

## 2016-07-02 DIAGNOSIS — Z79899 Other long term (current) drug therapy: Secondary | ICD-10-CM | POA: Insufficient documentation

## 2016-07-02 DIAGNOSIS — Z87891 Personal history of nicotine dependence: Secondary | ICD-10-CM

## 2016-07-02 DIAGNOSIS — Z792 Long term (current) use of antibiotics: Secondary | ICD-10-CM | POA: Insufficient documentation

## 2016-07-02 DIAGNOSIS — J209 Acute bronchitis, unspecified: Secondary | ICD-10-CM

## 2016-07-02 DIAGNOSIS — F17210 Nicotine dependence, cigarettes, uncomplicated: Secondary | ICD-10-CM | POA: Insufficient documentation

## 2016-07-02 DIAGNOSIS — R197 Diarrhea, unspecified: Secondary | ICD-10-CM | POA: Insufficient documentation

## 2016-07-02 MED ORDER — BENZONATATE 100 MG PO CAPS: 200.0000 mg | ORAL_CAPSULE | Freq: Three times a day (TID) | ORAL | 0 refills | Status: AC | PRN

## 2016-07-02 MED ORDER — AZITHROMYCIN 250 MG PO TABS: ORAL_TABLET | ORAL | 0 refills | Status: AC

## 2016-07-02 NOTE — ED Triage Notes (Signed)
PATIENT WANTS A WORK NOTE

## 2016-07-02 NOTE — ED Triage Notes (Signed)
Head congestion, chest congestion, nausea for 2 weeks, states he has taken thera flu, and OTC PRODUCTS FOR SAME, CONGESTED AND HAS SOME NAUSEA

## 2016-07-02 NOTE — ED Provider Notes (Signed)
[facility]-EMERGENCY DEPT Provider Note   CSN: [id number] Arrival date & time: 07/02/16  1659     History   Chief Complaint Chief Complaint  Patient presents with  . Nausea    HPI Paul Lynch is a 40 y.o. male presenting a now 2 week history of uri type symptoms which have settled in his chest.  He reports persistent nasal congestion along with post nasal drip, dry sinus and throat feeling but no pain along with a persistent cough which is occasionally productive of a white thick sputum along with morning only wheezing and subjective fevers.  He also endorses intermittent nausea with one episode of vomiting last week and an episode of diarrhea yesterday, but gone today.  He has taken theraflu without relief.  He is a 1/2 to 1 ppd smoker since the age of 89. He endorses mild sob when he first wakes, no chest pain or other complaint.  He states his boss made him come in to make sure he is healthy before returning to work as he has been unable to control his cough at work.  He works in a [employer], [employer] environment.   The history is provided by the patient.    No past medical history on file.  There are no active problems to display for this patient.   Past Surgical History:  Procedure Laterality Date  . FRACTURE SURGERY         Home Medications    Prior to Admission medications   Medication Sig Start Date End Date Taking? Authorizing Provider  amoxicillin (AMOXIL) 500 MG capsule Take 1 capsule (500 mg total) by mouth 3 (three) times daily. For 10 days 09/27/15   Tammy Triplett, PA-C  azithromycin (ZITHROMAX Z-PAK) 250 MG tablet Take 2 tablets by mouth on day one followed by one tablet daily for 4 days. 07/02/16   Burgess AmorJulie Rankin Coolman, PA-C  benzonatate (TESSALON) 100 MG capsule Take 2 capsules (200 mg total) by mouth 3 (three) times daily as needed. 07/02/16   Burgess AmorJulie Gwenn Teodoro, PA-C  cyclobenzaprine (FLEXERIL) 10 MG tablet Take 1 tablet (10 mg total) by mouth 2 (two) times daily as needed for muscle  spasms. Patient not taking: Reported on 02/19/2015 02/16/15   Janne NapoleonHope M Neese, NP  traMADol (ULTRAM) 50 MG tablet Take 1 tablet (50 mg total) by mouth every 6 (six) hours as needed. Patient not taking: Reported on 02/19/2015 02/16/15   Janne NapoleonHope M Neese, NP    Family History No family history on file.  Social History Social History  Substance Use Topics  . Smoking status: Current Every Day Smoker    Packs/day: 0.50    Types: Cigarettes  . Smokeless tobacco: Not on file  . Alcohol use Yes     Comment: occasional     Allergies   Review of patient's allergies indicates no known allergies.   Review of Systems Review of Systems  Constitutional: Positive for chills and fever.  HENT: Positive for congestion and rhinorrhea. Negative for ear pain, sinus pressure, sore throat, trouble swallowing and voice change.   Eyes: Negative for discharge.  Respiratory: Positive for cough, shortness of breath and wheezing. Negative for stridor.   Gastrointestinal: Positive for diarrhea, nausea and vomiting. Negative for abdominal pain.  Genitourinary: Negative.  Negative for decreased urine volume and dysuria.  Neurological: Negative for dizziness and weakness.     Physical Exam Updated Vital Signs BP 128/76 (BP Location: Left Arm)   Pulse 90   Temp 98.8 F (37.1 C) (Oral)  Resp 16   Ht 5\' 8"  (1.727 m)   Wt 77.1 kg   SpO2 98%   BMI 25.85 kg/m   Physical Exam  Constitutional: He is oriented to person, place, and time. He appears well-developed and well-nourished.  HENT:  Head: Normocephalic and atraumatic.  Right Ear: Tympanic membrane and ear canal normal.  Left Ear: Tympanic membrane and ear canal normal.  Nose: Mucosal edema and rhinorrhea present.  Mouth/Throat: Uvula is midline, oropharynx is clear and moist and mucous membranes are normal. No oropharyngeal exudate, posterior oropharyngeal edema, posterior oropharyngeal erythema or tonsillar abscesses.  Eyes: Conjunctivae are normal.    Neck: Normal range of motion.  Cardiovascular: Normal rate and normal heart sounds.   Pulmonary/Chest: Effort normal. No respiratory distress. He has decreased breath sounds in the right upper field and the left upper field. He has no wheezes. He has no rhonchi. He has no rales.  Frequent dry cough.  Abdominal: Soft. There is no tenderness. There is no guarding.  Musculoskeletal: Normal range of motion.  Neurological: He is alert and oriented to person, place, and time.  Skin: Skin is warm and dry. No rash noted.  Psychiatric: He has a normal mood and affect.     ED Treatments / Results  Labs (all labs ordered are listed, but only abnormal results are displayed) Labs Reviewed - No data to display  EKG  EKG Interpretation None       Radiology Dg Chest 2 View  Result Date: 07/02/2016 CLINICAL DATA:  Cough and wheezing.  Shortness of breath for 2 weeks EXAM: CHEST  2 VIEW COMPARISON:  None. FINDINGS: The heart size and mediastinal contours are within normal limits. Both lungs are clear. The visualized skeletal structures are unremarkable. IMPRESSION: No active cardiopulmonary disease. Electronically Signed   By: Signa Kell M.D.   On: 07/02/2016 17:53    Procedures Procedures (including critical care time)  Medications Ordered in ED Medications - No data to display   Initial Impression / Assessment and Plan / ED Course  I have reviewed the triage vital signs and the nursing notes.  Pertinent labs & imaging results that were available during my care of the patient were reviewed by me and considered in my medical decision making (see chart for details).  Clinical Course    No wheezing on exam. Pt appears well in no respiratory distress.  He was placed on z pack given bronchitis with smoking history.  Tessalon,  Rest, increased fluid intake.   Final Clinical Impressions(s) / ED Diagnoses   Final diagnoses:  Acute bronchitis, unspecified organism  Smoking hx     New Prescriptions New Prescriptions   AZITHROMYCIN (ZITHROMAX Z-PAK) 250 MG TABLET    Take 2 tablets by mouth on day one followed by one tablet daily for 4 days.   BENZONATATE (TESSALON) 100 MG CAPSULE    Take 2 capsules (200 mg total) by mouth 3 (three) times daily as needed.     Burgess Amor, PA-C 07/02/16 1856    Bethann Berkshire, MD 07/02/16 [phone]

## 2016-09-19 ENCOUNTER — Encounter (HOSPITAL_COMMUNITY): Payer: Self-pay | Admitting: Emergency Medicine

## 2016-09-19 ENCOUNTER — Emergency Department (HOSPITAL_COMMUNITY): Payer: Self-pay

## 2016-09-19 ENCOUNTER — Emergency Department (HOSPITAL_COMMUNITY)
Admission: EM | Admit: 2016-09-19 | Discharge: 2016-09-19 | Disposition: A | Discharge status: Home / Self Care | Payer: Self-pay | Attending: Emergency Medicine | Admitting: Emergency Medicine

## 2016-09-19 DIAGNOSIS — S43402A Unspecified sprain of left shoulder joint, initial encounter: Secondary | ICD-10-CM | POA: Insufficient documentation

## 2016-09-19 DIAGNOSIS — W1839XA Other fall on same level, initial encounter: Secondary | ICD-10-CM | POA: Insufficient documentation

## 2016-09-19 DIAGNOSIS — S161XXA Strain of muscle, fascia and tendon at neck level, initial encounter: Secondary | ICD-10-CM | POA: Insufficient documentation

## 2016-09-19 DIAGNOSIS — Y9389 Activity, other specified: Secondary | ICD-10-CM | POA: Insufficient documentation

## 2016-09-19 DIAGNOSIS — Y999 Unspecified external cause status: Secondary | ICD-10-CM | POA: Insufficient documentation

## 2016-09-19 DIAGNOSIS — Y929 Unspecified place or not applicable: Secondary | ICD-10-CM | POA: Insufficient documentation

## 2016-09-19 DIAGNOSIS — F17210 Nicotine dependence, cigarettes, uncomplicated: Secondary | ICD-10-CM | POA: Insufficient documentation

## 2016-09-19 MED ORDER — CYCLOBENZAPRINE HCL 10 MG PO TABS: 10.0000 mg | ORAL_TABLET | Freq: Three times a day (TID) | ORAL | 0 refills | Status: AC | PRN

## 2016-09-19 MED ORDER — DICLOFENAC SODIUM 75 MG PO TBEC: 75.0000 mg | DELAYED_RELEASE_TABLET | Freq: Two times a day (BID) | ORAL | 0 refills | Status: DC

## 2016-09-19 NOTE — ED Triage Notes (Signed)
Pt states dog pull him down this morning, complaining of shoulder pain, radiating in neck. Pt states it feels like muscular pain

## 2016-09-19 NOTE — ED Provider Notes (Signed)
[facility] DEPT Provider Note   CSN: [id number] Arrival date & time: 09/19/16  1037     History   Chief Complaint Chief Complaint  Patient presents with  . Shoulder Injury    HPI Paul Lynch is a 40 y.o. male.  HPI   Paul Lynch is a 40 y.o. male who presents to the Emergency Department complaining of left shoulder and neck pain that began this morning after a mechanical fall.  He states that he was taking his dog outside and her leash wrapped around his feet pulling him down.  He reports landing on his left shoulder and injured his neck.  He describes a pulling sensation with attempted movement of the neck or shoulder.  He has not tried any medications or therapies for symptomatic relief. He denies head injury, LOC, numbness or weakness of the extremity, swelling chest pain or shortness of breath.   History reviewed. No pertinent past medical history.  There are no active problems to display for this patient.   Past Surgical History:  Procedure Laterality Date  . FRACTURE SURGERY         Home Medications    Prior to Admission medications   Medication Sig Start Date End Date Taking? Authorizing Provider  amoxicillin (AMOXIL) 500 MG capsule Take 1 capsule (500 mg total) by mouth 3 (three) times daily. For 10 days 09/27/15   Dawan Farney, PA-C  azithromycin (ZITHROMAX Z-PAK) 250 MG tablet Take 2 tablets by mouth on day one followed by one tablet daily for 4 days. 07/02/16   Burgess AmorJulie Idol, PA-C  benzonatate (TESSALON) 100 MG capsule Take 2 capsules (200 mg total) by mouth 3 (three) times daily as needed. 07/02/16   Burgess AmorJulie Idol, PA-C  cyclobenzaprine (FLEXERIL) 10 MG tablet Take 1 tablet (10 mg total) by mouth 2 (two) times daily as needed for muscle spasms. Patient not taking: Reported on 02/19/2015 02/16/15   Janne NapoleonHope M Neese, NP  traMADol (ULTRAM) 50 MG tablet Take 1 tablet (50 mg total) by mouth every 6 (six) hours as needed. Patient not taking: Reported on 02/19/2015 02/16/15    Janne NapoleonHope M Neese, NP    Family History No family history on file.  Social History Social History  Substance Use Topics  . Smoking status: Current Every Day Smoker    Packs/day: 0.25    Types: Cigarettes  . Smokeless tobacco: Never Used  . Alcohol use Yes     Comment: occasional     Allergies   Patient has no known allergies.   Review of Systems Review of Systems  Constitutional: Negative for chills and fever.  Eyes: Negative for visual disturbance.  Respiratory: Negative for shortness of breath.   Cardiovascular: Negative for chest pain.  Gastrointestinal: Negative for abdominal pain, nausea and vomiting.  Genitourinary: Negative for difficulty urinating and dysuria.  Musculoskeletal: Positive for arthralgias (Left shoulder pain) and neck pain. Negative for joint swelling.  Skin: Negative for color change and wound.  Neurological: Negative for dizziness, syncope, weakness, numbness and headaches.  All other systems reviewed and are negative.    Physical Exam Updated Vital Signs BP 125/75 (BP Location: Left Arm)   Pulse 114   Temp 98.5 F (36.9 C) (Oral)   Resp 20   Ht 5\' 8"  (1.727 m)   Wt 81.6 kg   SpO2 99%   BMI 27.37 kg/m   Physical Exam  Constitutional: He appears well-developed and well-nourished. No distress.  HENT:  Head: Normocephalic.  Eyes: EOM are normal. Pupils  are equal, round, and reactive to light.  Neck: Trachea normal. Spinous process tenderness and muscular tenderness present. Decreased range of motion present.    Tenderness of the mid spine, no bony deformity noted. Tenderness along the left paraspinal muscle and trapiezus muscles.    Cardiovascular: Normal rate, regular rhythm and intact distal pulses.   Pulmonary/Chest: Effort normal and breath sounds normal. No respiratory distress.  Musculoskeletal:  Tenderness along the left joint and lateral deltoid. No bony deformity or step-off noted. Pain is reproduced with abduction strength is  strong and symmetrical distal sensation intact left elbow and wrist are nontender  Lymphadenopathy:    He has no cervical adenopathy.  Nursing note and vitals reviewed.    ED Treatments / Results  Labs (all labs ordered are listed, but only abnormal results are displayed) Labs Reviewed - No data to display  EKG  EKG Interpretation None       Radiology Dg Cervical Spine Complete  Result Date: 09/19/2016 CLINICAL DATA:  POST NECK PAIN WITH TOP OF LEFT SHOULDER PAIN S/P TAKING DOG OUT TODAY ON LEASH WHEN DOG WRAPPED PT IN LEASH CAUSING HIM TO FALL DOWN 3 STEPS HITTING NECK AND SHOULDER ON 2ND STEP EXAM: CERVICAL SPINE - COMPLETE 4+ VIEW COMPARISON:  None. FINDINGS: No fracture.  No spondylolisthesis. Mild endplate spurring at C6-C7. The discs are well maintained in height. No other degenerative change. Soft tissues are unremarkable. IMPRESSION: No fracture, spondylolisthesis or acute finding. Electronically Signed   By: Amie Portlandavid  Ormond M.D.   On: 09/19/2016 11:36   Dg Shoulder Left  Result Date: 09/19/2016 CLINICAL DATA:  POST NECK PAIN WITH TOP OF LEFT SHOULDER PAIN S/P TAKING DOG OUT TODAY ON LEASH WHEN DOG WRAPPED PT IN LEASH CAUSING HIM TO FALL DOWN 3 STEPS HITTING NECK AND SHOULDER ON 2ND STEP EXAM: LEFT SHOULDER - 2+ VIEW COMPARISON:  None. FINDINGS: There is no evidence of fracture or dislocation. There is no evidence of arthropathy or other focal bone abnormality. Soft tissues are unremarkable. IMPRESSION: Negative. Electronically Signed   By: Amie Portlandavid  Ormond M.D.   On: 09/19/2016 11:36    Procedures Procedures (including critical care time)  Medications Ordered in ED Medications - No data to display   Initial Impression / Assessment and Plan / ED Course  I have reviewed the triage vital signs and the nursing notes.  Pertinent labs & imaging results that were available during my care of the patient were reviewed by me and considered in my medical decision making (see chart  for details).  Clinical Course     Patient is well-appearing. Neurovascularly intact. X-rays are reassuring. Likely schedule skeletal injury. Patient agrees to symptomatic treatment and referral given for orthopedics if needed. Will treat with muscle relaxer and anti-inflammatory.  Final Clinical Impressions(s) / ED Diagnoses   Final diagnoses:  Acute strain of neck muscle, initial encounter  Sprain of left shoulder, unspecified shoulder sprain type, initial encounter    New Prescriptions New Prescriptions   No medications on file     Pauline Ausammy Alexis Reber, Cordelia Poche-C 09/19/16 1200    Donnetta HutchingBrian Cook, MD 09/19/16 1447

## 2016-09-19 NOTE — Discharge Instructions (Signed)
Apply ice packs on and off to your neck and shoulder. Avoid heavy lifting or straining with the left arm for 1 week. Follow-up with one of the orthopedic providers listed if symptoms are not improving in 1 week.

## 2016-09-19 NOTE — ED Notes (Signed)
Patient transported to X-ray 

## 2017-02-17 ENCOUNTER — Encounter (HOSPITAL_COMMUNITY): Payer: Self-pay

## 2017-02-17 ENCOUNTER — Emergency Department (HOSPITAL_COMMUNITY)
Admission: EM | Admit: 2017-02-17 | Discharge: 2017-02-17 | Disposition: A | Discharge status: Home / Self Care | Payer: Self-pay | Attending: Emergency Medicine | Admitting: Emergency Medicine

## 2017-02-17 DIAGNOSIS — H1089 Other conjunctivitis: Secondary | ICD-10-CM | POA: Insufficient documentation

## 2017-02-17 DIAGNOSIS — Y929 Unspecified place or not applicable: Secondary | ICD-10-CM | POA: Insufficient documentation

## 2017-02-17 DIAGNOSIS — F17210 Nicotine dependence, cigarettes, uncomplicated: Secondary | ICD-10-CM | POA: Insufficient documentation

## 2017-02-17 DIAGNOSIS — X58XXXA Exposure to other specified factors, initial encounter: Secondary | ICD-10-CM | POA: Insufficient documentation

## 2017-02-17 DIAGNOSIS — T1592XA Foreign body on external eye, part unspecified, left eye, initial encounter: Secondary | ICD-10-CM

## 2017-02-17 DIAGNOSIS — Y939 Activity, unspecified: Secondary | ICD-10-CM | POA: Insufficient documentation

## 2017-02-17 DIAGNOSIS — Y999 Unspecified external cause status: Secondary | ICD-10-CM | POA: Insufficient documentation

## 2017-02-17 DIAGNOSIS — T1502XA Foreign body in cornea, left eye, initial encounter: Secondary | ICD-10-CM | POA: Insufficient documentation

## 2017-02-17 MED ORDER — TETRACAINE HCL 0.5 % OP SOLN: 1.0000 [drp] | Freq: Once | OPHTHALMIC | Status: DC

## 2017-02-17 MED ORDER — HOMATROPINE HBR 5 % OP SOLN
2.0000 [drp] | Freq: Once | OPHTHALMIC | Status: DC
  Filled 2017-02-17: qty 5

## 2017-02-17 MED ORDER — TETRACAINE HCL 0.5 % OP SOLN
OPHTHALMIC | Status: AC
  Filled 2017-02-17: qty 4

## 2017-02-17 MED ORDER — FLUORESCEIN SODIUM 0.6 MG OP STRP
ORAL_STRIP | OPHTHALMIC | Status: AC
  Filled 2017-02-17: qty 1

## 2017-02-17 MED ORDER — FLUORESCEIN SODIUM 0.6 MG OP STRP: 1.0000 | ORAL_STRIP | Freq: Once | OPHTHALMIC | Status: DC

## 2017-02-17 MED ORDER — ERYTHROMYCIN 5 MG/GM OP OINT
TOPICAL_OINTMENT | Freq: Once | OPHTHALMIC | Status: AC
  Administered 2017-02-17: 1 via OPHTHALMIC
  Filled 2017-02-17: qty 3.5

## 2017-02-17 NOTE — ED Provider Notes (Signed)
[facility] Provider Note   CSN: [id number] Arrival date & time: 02/17/17  0827     History   Chief Complaint Chief Complaint  Patient presents with  . Eye Pain    HPI Paul Lynch is a 41 y.o. male presenting with a foreign body sensation in his left eye, present for the past couple of days.  He has tried home flushing without relief.  He also describes persistent pressure sensation in the left eye and has increased pain with bright light.  There has been copious clear watering from the eye, no purulent discharge.  He does not wear glasses or contacts. His vision in the eye has been blurry today. .  The history is provided by the patient.    History reviewed. No pertinent past medical history.  There are no active problems to display for this patient.   Past Surgical History:  Procedure Laterality Date  . FRACTURE SURGERY         Home Medications    Prior to Admission medications   Medication Sig Start Date End Date Taking? Authorizing Provider  naproxen sodium (ANAPROX) 220 MG tablet Take 220-660 mg by mouth daily.   Yes [provider]  amoxicillin (AMOXIL) 500 MG capsule Take 1 capsule (500 mg total) by mouth 3 (three) times daily. For 10 days 09/27/15   Triplett, Tammy, PA-C  azithromycin (ZITHROMAX Z-PAK) 250 MG tablet Take 2 tablets by mouth on day one followed by one tablet daily for 4 days. Patient not taking: Reported on 02/17/2017 07/02/16   Burgess AmorIdol, Jerrell Mangel, PA-C  benzonatate (TESSALON) 100 MG capsule Take 2 capsules (200 mg total) by mouth 3 (three) times daily as needed. 07/02/16   Burgess AmorIdol, Morgon Pamer, PA-C  cyclobenzaprine (FLEXERIL) 10 MG tablet Take 1 tablet (10 mg total) by mouth 3 (three) times daily as needed. 09/19/16   Triplett, Tammy, PA-C  diclofenac (VOLTAREN) 75 MG EC tablet Take 1 tablet (75 mg total) by mouth 2 (two) times daily. Take with food 09/19/16   Triplett, Tammy, PA-C  traMADol (ULTRAM) 50 MG tablet Take 1 tablet (50 mg total) by mouth  every 6 (six) hours as needed. Patient not taking: Reported on 02/19/2015 02/16/15   Janne NapoleonNeese, Hope M, NP    Family History No family history on file.  Social History Social History  Substance Use Topics  . Smoking status: Current Every Day Smoker    Packs/day: 0.25    Types: Cigarettes  . Smokeless tobacco: Never Used  . Alcohol use Yes     Comment: occasional     Allergies   Patient has no known allergies.   Review of Systems Review of Systems  Constitutional: Negative for fever.  HENT: Negative for congestion and sore throat.   Eyes: Positive for photophobia, pain and redness. Negative for discharge.  Respiratory: Negative for chest tightness and shortness of breath.   Cardiovascular: Negative for chest pain.  Gastrointestinal: Negative for abdominal pain and nausea.  Genitourinary: Negative.   Musculoskeletal: Negative for arthralgias, joint swelling and neck pain.  Skin: Negative.  Negative for rash and wound.  Neurological: Negative for dizziness, weakness, light-headedness, numbness and headaches.  Psychiatric/Behavioral: Negative.      Physical Exam Updated Vital Signs BP 109/74 (BP Location: Left Arm)   Pulse 75   Temp 98.1 F (36.7 C) (Oral)   Resp 16   Ht 5\' 8"  (1.727 m)   Wt 88.5 kg (195 lb)   SpO2 97%   BMI 29.65 kg/m  Physical Exam  Constitutional: He appears well-developed and well-nourished.  HENT:  Head: Normocephalic and atraumatic.  Eyes: EOM are normal. Pupils are equal, round, and reactive to light. Foreign body present in the left eye. Left conjunctiva is injected.  Slit lamp exam:      The left eye shows no corneal abrasion, no corneal flare, no corneal ulcer, no foreign body, no hyphema and no fluorescein uptake.  Small pale colored foreign body of unclear etiology (sawdust?) eyelid removed with sweeping, but with persistent fb sensation.  Increased pain left eye with consensual  light in the right eye.  Ocular pressures measured x 3 -  21, 11 and 10 mmhg.  Visual acuity Bilateral Distance: 20/13 R Distance: 20/15 L Distance: 20/15    Neck: Normal range of motion.  Cardiovascular: Normal rate, regular rhythm, normal heart sounds and intact distal pulses.   Musculoskeletal: Normal range of motion.  Neurological: He is alert.  Skin: Skin is warm and dry.  Psychiatric: He has a normal mood and affect.  Nursing note and vitals reviewed.    ED Treatments / Results  Labs (all labs ordered are listed, but only abnormal results are displayed) Labs Reviewed - No data to display  EKG  EKG Interpretation None       Radiology No results found.  Procedures Procedures (including critical care time)  Medications Ordered in ED Medications  erythromycin ophthalmic ointment (1 application Left Eye Given 02/17/17 1130)     Initial Impression / Assessment and Plan / ED Course  I have reviewed the triage vital signs and the nursing notes.  Pertinent labs & imaging results that were available during my care of the patient were reviewed by me and considered in my medical decision making (see chart for details).     Pt's eye was flushed with NS using morgan lens after fb sensation persisted despite removal of suspected sawdust fb.  No further fb sensation and pt's consensual pain was no longer present.  No fluorescein dye uptake, no corneal abrasion, pressures and visual acuity normal.  Pt was given erythromycin ointment to treat conjunctivitis and pain.  Referral to ophthalmology for recheck if sx are not improving over 48 hours or for any worsened sx.   Final Clinical Impressions(s) / ED Diagnoses   Final diagnoses:  Other conjunctivitis of left eye  Foreign body of left eye, initial encounter    New Prescriptions Discharge Medication List as of 02/17/2017 11:28 AM       Burgess Amor, PA-C 02/17/17 2138    Blane Ohara, MD 02/19/17 [phone]

## 2017-02-17 NOTE — Discharge Instructions (Signed)
Apply the antibiotic ointment in your left eye (dab along the lower eyelid, blinking will work it across your eye) 3 times daily for up to 7 days (or until the redness and irritation is resolved. Return here or call Dr. Harvel QualeAbugo for further evaluation if your symptoms are not improving over the next 2 days as discussed.

## 2017-02-17 NOTE — ED Triage Notes (Signed)
Pt reports thinks has a piece of saw dust or dirt in left eye.  Reports he got something in his eye a couple of days ago.  Eye is red and watery.  Reports pressure behind eye and blurred vision today.

## 2017-02-26 ENCOUNTER — Encounter (HOSPITAL_COMMUNITY): Payer: Self-pay | Admitting: Emergency Medicine

## 2017-02-26 ENCOUNTER — Emergency Department (HOSPITAL_COMMUNITY)
Admission: EM | Admit: 2017-02-26 | Discharge: 2017-02-26 | Disposition: A | Discharge status: Home Health | Payer: Self-pay | Attending: Emergency Medicine | Admitting: Emergency Medicine

## 2017-02-26 ENCOUNTER — Emergency Department (HOSPITAL_COMMUNITY): Payer: Self-pay

## 2017-02-26 DIAGNOSIS — F17210 Nicotine dependence, cigarettes, uncomplicated: Secondary | ICD-10-CM | POA: Insufficient documentation

## 2017-02-26 DIAGNOSIS — M25512 Pain in left shoulder: Secondary | ICD-10-CM | POA: Insufficient documentation

## 2017-02-26 MED ORDER — DICLOFENAC SODIUM 75 MG PO TBEC: 75.0000 mg | DELAYED_RELEASE_TABLET | Freq: Two times a day (BID) | ORAL | 0 refills | Status: AC

## 2017-02-26 NOTE — ED Provider Notes (Signed)
[facility] Provider Note   CSN: [id number] Arrival date & time: 02/26/17  1003     History   Chief Complaint Chief Complaint  Patient presents with  . Shoulder Pain    HPI Paul Lynch is a 41 y.o. male.  HPI  Paul Lynch is a 41 y.o. male with hx of chronic left shoulder pain,  presents to the Emergency Department complaining of increased left shoulder pain that began this morning.  He describes a "pop" in the shoulder while turning to get out of bed.  He has pain with attempted movement of the arm.  Pain improves at rest.  He has not tried any therapies for symptoms relief.  Denies swelling, skin changes, fever, neck pain, numbness of arm.  History reviewed. No pertinent past medical history.  There are no active problems to display for this patient.   Past Surgical History:  Procedure Laterality Date  . FRACTURE SURGERY         Home Medications    Prior to Admission medications   Medication Sig Start Date End Date Taking? Authorizing Provider  amoxicillin (AMOXIL) 500 MG capsule Take 1 capsule (500 mg total) by mouth 3 (three) times daily. For 10 days 09/27/15   Levonia Wolfley, PA-C  azithromycin (ZITHROMAX Z-PAK) 250 MG tablet Take 2 tablets by mouth on day one followed by one tablet daily for 4 days. Patient not taking: Reported on 02/17/2017 07/02/16   Burgess Amor, PA-C  benzonatate (TESSALON) 100 MG capsule Take 2 capsules (200 mg total) by mouth 3 (three) times daily as needed. 07/02/16   Burgess Amor, PA-C  cyclobenzaprine (FLEXERIL) 10 MG tablet Take 1 tablet (10 mg total) by mouth 3 (three) times daily as needed. 09/19/16   Dieter Hane, PA-C  diclofenac (VOLTAREN) 75 MG EC tablet Take 1 tablet (75 mg total) by mouth 2 (two) times daily. Take with food 09/19/16   Williams Dietrick, PA-C  naproxen sodium (ANAPROX) 220 MG tablet Take 220-660 mg by mouth daily.    [provider]  traMADol (ULTRAM) 50 MG tablet Take 1 tablet (50 mg total) by mouth  every 6 (six) hours as needed. Patient not taking: Reported on 02/19/2015 02/16/15   Janne Napoleon, NP    Family History History reviewed. No pertinent family history.  Social History Social History  Substance Use Topics  . Smoking status: Current Every Day Smoker    Packs/day: 0.25    Types: Cigarettes  . Smokeless tobacco: Never Used  . Alcohol use Yes     Comment: occasional     Allergies   Patient has no known allergies.   Review of Systems Review of Systems  Constitutional: Negative for chills and fever.  Genitourinary: Negative for difficulty urinating and dysuria.  Musculoskeletal: Positive for arthralgias and joint swelling.  Skin: Negative for color change and wound.  All other systems reviewed and are negative.    Physical Exam Updated Vital Signs BP 122/69 (BP Location: Right Arm)   Pulse 97   Temp 98.4 F (36.9 C) (Oral)   Resp 16   Ht 5\' 8"  (1.727 m)   Wt 88.5 kg (195 lb)   SpO2 99%   BMI 29.65 kg/m   Physical Exam  Constitutional: He is oriented to person, place, and time. He appears well-developed and well-nourished. No distress.  HENT:  Head: Normocephalic and atraumatic.  Neck: Normal range of motion. Neck supple. No thyromegaly present.  Cardiovascular: Normal rate, regular rhythm and intact distal pulses.  Pulmonary/Chest: Effort normal and breath sounds normal. No respiratory distress. He exhibits no tenderness.  Musculoskeletal: He exhibits tenderness. He exhibits no edema.  ttp of the anterior left shoulder.  Grip strength is strong and symmetrical.   No edema , erythema.  Lymphadenopathy:    He has no cervical adenopathy.  Neurological: He is alert and oriented to person, place, and time. He has normal strength. No sensory deficit. He exhibits normal muscle tone. Coordination normal.  Reflex Scores:      Tricep reflexes are 1+ on the right side and 2+ on the left side.      Bicep reflexes are 1+ on the right side and 2+ on the left  side. Skin: Skin is warm and dry. Capillary refill takes less than 2 seconds.  Nursing note and vitals reviewed.    ED Treatments / Results  Labs (all labs ordered are listed, but only abnormal results are displayed) Labs Reviewed - No data to display  EKG  EKG Interpretation None       Radiology Dg Shoulder Left  Result Date: 02/26/2017 CLINICAL DATA:  Acute onset of pain after lifting EXAM: LEFT SHOULDER - 2+ VIEW COMPARISON:  September 19, 2016 FINDINGS: Frontal, Y scapular, and axillary images were obtained. There is no fracture or dislocation. The joint spaces appear normal. No erosive change. Visualized left lung is clear. IMPRESSION: No fracture or dislocation.  No evident arthropathy. Electronically Signed   By: Bretta BangWilliam  Woodruff III M.D.   On: 02/26/2017 10:55    Procedures Procedures (including critical care time)  Medications Ordered in ED Medications - No data to display   Initial Impression / Assessment and Plan / ED Course  I have reviewed the triage vital signs and the nursing notes.  Pertinent labs & imaging results that were available during my care of the patient were reviewed by me and considered in my medical decision making (see chart for details).     XR neg.  NV intact.  Likely acute or chronic pain.  No concerning sx's for septic joint.  Pt agrees to orthopedic f/u, referral info given.   Final Clinical Impressions(s) / ED Diagnoses   Final diagnoses:  Acute pain of left shoulder    New Prescriptions New Prescriptions   No medications on file     Rosey Bathriplett, Jacqlyn Marolf, PA-C 02/26/17 2048    Eber HongMiller, Brian, MD 02/27/17 [phone]

## 2017-02-26 NOTE — Discharge Instructions (Signed)
Call the case worker here at the hospital to see if you qualify for any assistance.  Call Dr. Mort SawyersHArrison's office to arrange a follow-up appt.

## 2017-02-26 NOTE — ED Triage Notes (Signed)
Patient states he felt a pop and pain in left shoulder trying to get out of bed this morning.

## 2017-04-06 ENCOUNTER — Ambulatory Visit: Payer: Self-pay | Admitting: Orthopedic Surgery

## 2017-04-28 ENCOUNTER — Encounter: Payer: Self-pay | Admitting: Orthopedic Surgery

## 2017-04-28 ENCOUNTER — Ambulatory Visit (INDEPENDENT_AMBULATORY_CARE_PROVIDER_SITE_OTHER): Payer: 59 | Admitting: Orthopedic Surgery

## 2017-04-28 VITALS — BP 124/77 | HR 85 | Wt 183.0 lb

## 2017-04-28 DIAGNOSIS — M75122 Complete rotator cuff tear or rupture of left shoulder, not specified as traumatic: Secondary | ICD-10-CM | POA: Diagnosis not present

## 2017-04-28 DIAGNOSIS — M5020 Other cervical disc displacement, unspecified cervical region: Secondary | ICD-10-CM

## 2017-04-28 DIAGNOSIS — G8929 Other chronic pain: Secondary | ICD-10-CM | POA: Diagnosis not present

## 2017-04-28 DIAGNOSIS — M5030 Other cervical disc degeneration, unspecified cervical region: Secondary | ICD-10-CM | POA: Diagnosis not present

## 2017-04-28 DIAGNOSIS — M25512 Pain in left shoulder: Secondary | ICD-10-CM | POA: Diagnosis not present

## 2017-04-28 MED ORDER — GABAPENTIN 100 MG PO CAPS: 100.0000 mg | ORAL_CAPSULE | Freq: Three times a day (TID) | ORAL | 2 refills | Status: AC

## 2017-04-28 MED ORDER — PREDNISONE 10 MG (48) PO TBPK: ORAL_TABLET | Freq: Every day | ORAL | 1 refills | Status: AC

## 2017-04-28 NOTE — Patient Instructions (Signed)
Work note no heavy lifting with left upper extremity for the next 4 weeks

## 2017-04-28 NOTE — Progress Notes (Signed)
NEW PATIENT OFFICE VISIT    Chief Complaint  Patient presents with  . New Patient (Initial Visit)    left shoulder injury unknown date    This is a 41 year old male who presents for evaluation of his left shoulder and left arm pain  The patient has had 3 injuries in the recent past. Several of them involved falling jerking his arm.  He now complains of severe pain in his left arm with burning pain running into his left thumb as well as pain in his cervical spine and left trapezius muscle with severe spasm and decreased cervical range of motion  He has painful left shoulder with decreased range of motion this pain is in the anterior shoulder and the rotator interval and  radiates from the shoulder to the elbow. He reports weakness in the left upper extremity with abduction and flexion   Current treatment includes Flexeril and tramadol     Review of Systems  Respiratory: Negative for shortness of breath.   Cardiovascular: Negative for chest pain.  Musculoskeletal: Positive for joint pain and neck pain.  Neurological: Positive for tingling, sensory change and focal weakness.     Past Medical History:  Diagnosis Date  . Fractures     Past Surgical History:  Procedure Laterality Date  . FRACTURE SURGERY      No family history on file. Social History  Substance Use Topics  . Smoking status: Current Every Day Smoker    Packs/day: 0.25    Types: Cigarettes  . Smokeless tobacco: Never Used  . Alcohol use Yes     Comment: occasional    BP 124/77   Pulse 85   Wt 183 lb (83 kg)   BMI 27.83 kg/m   Physical Exam  Constitutional:  General appearance well-developed well-nourished Oriented 3 Mood affect normal Gait normal    Ortho Exam   The patient's right shoulder has a positive sulcus sign and he has mild signs of generalized ligament laxity  Right upper extremity no tenderness or swelling, full range of motion stability and strength of the right shoulder skin  is warm dry and intact without rash lesion or ulceration neurovascular exam normal lymph nodes negative  Left shoulder painful abduction tenderness over the left rotator interval left trapezius muscle No weakness in external rotation. Painful abduction and flexion to 90 and 120 respectively  Painful cervical range of motion with decreased range of motion in all planes  X-ray of the cervical spine shows a hard disc at C6 and 7  Shoulder x-ray is normal  Encounter Diagnoses  Name Primary?  . Herniated cervical disc   . Complete rotator cuff tear of left shoulder Yes    Recommend MRI cervical spine and shoulder left shoulder  Following medications:   Meds ordered this encounter  Medications  . predniSONE (STERAPRED UNI-PAK 48 TAB) 10 MG (48) TBPK tablet    Sig: Take by mouth daily. 10 mg 12 day dose pack    Dispense:  48 tablet    Refill:  1  . gabapentin (NEURONTIN) 100 MG capsule    Sig: Take 1 capsule (100 mg total) by mouth 3 (three) times daily. Or 300 mg at night    Dispense:  90 capsule    Refill:  2    Encounter Diagnoses  Name Primary?  . Herniated cervical disc   . Complete rotator cuff tear of left shoulder Yes     PLAN:   Recommend MRI shoulder and neck  Work  note should reflect no heavy lifting with left upper extremity

## 2017-04-28 NOTE — Addendum Note (Signed)
Addended by: Adella HareBOOTHE, Tiago Humphrey B on: 04/28/2017 04:30 PM   Modules accepted: Orders

## 2017-05-06 ENCOUNTER — Ambulatory Visit (HOSPITAL_COMMUNITY)
Admission: RE | Admit: 2017-05-06 | Discharge: 2017-05-06 | Disposition: A | Discharge status: Home / Self Care | Payer: 59 | Source: Ambulatory Visit | Attending: Orthopedic Surgery | Admitting: Orthopedic Surgery

## 2017-05-06 DIAGNOSIS — M4802 Spinal stenosis, cervical region: Secondary | ICD-10-CM | POA: Insufficient documentation

## 2017-05-06 DIAGNOSIS — G8929 Other chronic pain: Secondary | ICD-10-CM

## 2017-05-06 DIAGNOSIS — M50222 Other cervical disc displacement at C5-C6 level: Secondary | ICD-10-CM | POA: Diagnosis not present

## 2017-05-06 DIAGNOSIS — M65812 Other synovitis and tenosynovitis, left shoulder: Secondary | ICD-10-CM | POA: Diagnosis not present

## 2017-05-06 DIAGNOSIS — M12812 Other specific arthropathies, not elsewhere classified, left shoulder: Secondary | ICD-10-CM | POA: Insufficient documentation

## 2017-05-06 DIAGNOSIS — M7552 Bursitis of left shoulder: Secondary | ICD-10-CM | POA: Insufficient documentation

## 2017-05-06 DIAGNOSIS — M25512 Pain in left shoulder: Principal | ICD-10-CM

## 2017-05-06 DIAGNOSIS — M5030 Other cervical disc degeneration, unspecified cervical region: Secondary | ICD-10-CM

## 2017-05-06 DIAGNOSIS — M75102 Unspecified rotator cuff tear or rupture of left shoulder, not specified as traumatic: Secondary | ICD-10-CM | POA: Insufficient documentation

## 2017-05-06 DIAGNOSIS — M47812 Spondylosis without myelopathy or radiculopathy, cervical region: Secondary | ICD-10-CM | POA: Insufficient documentation

## 2017-05-08 ENCOUNTER — Telehealth: Payer: Self-pay | Admitting: Orthopedic Surgery

## 2017-05-08 ENCOUNTER — Ambulatory Visit: Payer: 59 | Admitting: Orthopedic Surgery

## 2017-05-08 NOTE — Telephone Encounter (Signed)
Dx 1 labral tear left shoulder  Referral dr dean    Dx 2 cervical congenital spianal stenosis  Referral [facility] neurosurgery

## 2017-05-12 ENCOUNTER — Other Ambulatory Visit: Payer: Self-pay | Admitting: *Deleted

## 2017-05-12 ENCOUNTER — Telehealth: Payer: Self-pay | Admitting: Orthopedic Surgery

## 2017-05-12 DIAGNOSIS — M4802 Spinal stenosis, cervical region: Secondary | ICD-10-CM

## 2017-05-12 DIAGNOSIS — S43432D Superior glenoid labrum lesion of left shoulder, subsequent encounter: Secondary | ICD-10-CM

## 2017-05-13 ENCOUNTER — Encounter: Payer: Self-pay | Admitting: Orthopedic Surgery

## 2017-05-13 ENCOUNTER — Encounter (HOSPITAL_COMMUNITY): Payer: Self-pay | Admitting: Orthopedic Surgery

## 2017-05-13 NOTE — Telephone Encounter (Signed)
Patient called 05/12/17, relays that his employer and team had a meeting with him, and determined that there is no light duty work available, and that he is being taken out of work. Aware that his referral appointment is scheduled at [facility] on 05/27/17. Requests out of work note until this appointment date. (Done)  Patient is aware that once he is under the provider's care at [facility], he will need to obtain work notes and have forms completed through their clinic.

## 2017-05-26 ENCOUNTER — Emergency Department (HOSPITAL_COMMUNITY)
Admission: EM | Admit: 2017-05-26 | Discharge: 2017-05-26 | Disposition: A | Discharge status: Home / Self Care | Payer: 59 | Attending: Emergency Medicine | Admitting: Emergency Medicine

## 2017-05-26 ENCOUNTER — Encounter (HOSPITAL_COMMUNITY): Payer: Self-pay | Admitting: *Deleted

## 2017-05-26 DIAGNOSIS — F17210 Nicotine dependence, cigarettes, uncomplicated: Secondary | ICD-10-CM | POA: Diagnosis not present

## 2017-05-26 DIAGNOSIS — Z79899 Other long term (current) drug therapy: Secondary | ICD-10-CM | POA: Diagnosis not present

## 2017-05-26 DIAGNOSIS — Y9389 Activity, other specified: Secondary | ICD-10-CM | POA: Insufficient documentation

## 2017-05-26 DIAGNOSIS — W270XXA Contact with workbench tool, initial encounter: Secondary | ICD-10-CM | POA: Insufficient documentation

## 2017-05-26 DIAGNOSIS — Y929 Unspecified place or not applicable: Secondary | ICD-10-CM | POA: Diagnosis not present

## 2017-05-26 DIAGNOSIS — S0181XA Laceration without foreign body of other part of head, initial encounter: Secondary | ICD-10-CM | POA: Diagnosis not present

## 2017-05-26 DIAGNOSIS — S0993XA Unspecified injury of face, initial encounter: Secondary | ICD-10-CM | POA: Diagnosis present

## 2017-05-26 DIAGNOSIS — Y999 Unspecified external cause status: Secondary | ICD-10-CM | POA: Diagnosis not present

## 2017-05-26 HISTORY — DX: Bursopathy, unspecified: M71.9

## 2017-05-26 MED ORDER — POVIDONE-IODINE 10 % EX SOLN
CUTANEOUS | Status: DC | PRN
  Administered 2017-05-26: 17:00:00 via TOPICAL
  Filled 2017-05-26: qty 15

## 2017-05-26 MED ORDER — LIDOCAINE HCL (PF) 2 % IJ SOLN
INTRAMUSCULAR | Status: AC
  Administered 2017-05-26: 5 mL via INTRADERMAL
  Filled 2017-05-26: qty 10

## 2017-05-26 MED ORDER — LIDOCAINE HCL (PF) 2 % IJ SOLN
5.0000 mL | Freq: Once | INTRAMUSCULAR | Status: AC
  Administered 2017-05-26: 5 mL via INTRADERMAL

## 2017-05-26 NOTE — ED Triage Notes (Signed)
Pt comes in for laceration to upper lip. States he was trying to get and axel nut loose and the breaker bar came back and hit him in the lip. Denies in loc.

## 2017-05-26 NOTE — ED Provider Notes (Signed)
[facility]Y DEPT Provider Note   CSN: [id number] Arrival date & time: 05/26/17  1540     History   Chief Complaint Chief Complaint  Patient presents with  . Laceration    HPI Paul Lynch is a 41 y.o. male.  HPI   Paul Lynch is a 41 y.o. male who presents to the Emergency Department complaining of laceration above the left upper lip that occurred shortly before ER arrival.  He was using a "breaker bar" when it slipped striking his face.  He complains of a non-tender laceration.  He denies head injury, neck pain, LOC, visual changes, dizziness, dental pain and malocclusion.  Td is up to date.  He has controlled bleeding prior to arrival.    Past Medical History:  Diagnosis Date  . Bursitis   . Fractures     There are no active problems to display for this patient.   Past Surgical History:  Procedure Laterality Date  . FRACTURE SURGERY         Home Medications    Prior to Admission medications   Medication Sig Start Date End Date Taking? Authorizing Provider  amoxicillin (AMOXIL) 500 MG capsule Take 1 capsule (500 mg total) by mouth 3 (three) times daily. For 10 days Patient not taking: Reported on 04/28/2017 09/27/15   Celes Dedic, PA-C  azithromycin (ZITHROMAX Z-PAK) 250 MG tablet Take 2 tablets by mouth on day one followed by one tablet daily for 4 days. Patient not taking: Reported on 02/17/2017 07/02/16   Burgess Amor, PA-C  benzonatate (TESSALON) 100 MG capsule Take 2 capsules (200 mg total) by mouth 3 (three) times daily as needed. Patient not taking: Reported on 04/28/2017 07/02/16   Burgess Amor, PA-C  cyclobenzaprine (FLEXERIL) 10 MG tablet Take 1 tablet (10 mg total) by mouth 3 (three) times daily as needed. Patient not taking: Reported on 04/28/2017 09/19/16   Pauline Aus, PA-C  diclofenac (VOLTAREN) 75 MG EC tablet Take 1 tablet (75 mg total) by mouth 2 (two) times daily. Take with food Patient not taking: Reported on 04/28/2017 02/26/17   Shanvi Moyd, PA-C   gabapentin (NEURONTIN) 100 MG capsule Take 1 capsule (100 mg total) by mouth 3 (three) times daily. Or 300 mg at night 04/28/17   Vickki Hearing, MD  predniSONE (STERAPRED UNI-PAK 48 TAB) 10 MG (48) TBPK tablet Take by mouth daily. 10 mg 12 day dose pack 04/28/17   Vickki Hearing, MD  traMADol (ULTRAM) 50 MG tablet Take 1 tablet (50 mg total) by mouth every 6 (six) hours as needed. Patient not taking: Reported on 02/19/2015 02/16/15   Janne Napoleon, NP    Family History No family history on file.  Social History Social History  Substance Use Topics  . Smoking status: Current Every Day Smoker    Packs/day: 0.25    Types: Cigarettes  . Smokeless tobacco: Never Used  . Alcohol use Yes     Comment: occasional     Allergies   Patient has no known allergies.   Review of Systems Review of Systems  Constitutional: Negative for chills and fever.  HENT: Negative for dental problem and trouble swallowing.   Musculoskeletal: Negative for arthralgias, back pain and joint swelling.  Skin: Positive for wound.       Laceration left face  Neurological: Negative for dizziness, syncope, weakness, numbness and headaches.  Hematological: Does not bruise/bleed easily.  All other systems reviewed and are negative.    Physical Exam Updated Vital Signs  BP 122/75 (BP Location: Right Arm)   Pulse (!) 106   Temp 98.8 F (37.1 C) (Oral)   Resp 16   Ht 5\' 8"  (1.727 m)   SpO2 96%   Physical Exam  Constitutional: He is oriented to person, place, and time. He appears well-developed and well-nourished. No distress.  HENT:  Head: Normocephalic. Head is with laceration.    Nose: No sinus tenderness or nasal deformity. No epistaxis.  Mouth/Throat: Uvula is midline, oropharynx is clear and moist and mucous membranes are normal. No trismus in the jaw. Normal dentition. Dental caries present.  L shaped laceration superior to the left upper lip.  Does not extend into the oral mucosa.  Bleeding  controlled.  No dental injury or malocclusion. Poor dentition and multiple dental caries.  Neck: Normal range of motion. Neck supple.  Cardiovascular: Normal rate, regular rhythm and intact distal pulses.   Pulmonary/Chest: Effort normal and breath sounds normal. No respiratory distress.  Musculoskeletal: Normal range of motion. He exhibits no edema or tenderness.  Neurological: He is alert and oriented to person, place, and time. No sensory deficit. He exhibits normal muscle tone. Coordination normal.  Skin: Skin is warm. Capillary refill takes less than 2 seconds.  Psychiatric: He has a normal mood and affect.  Nursing note and vitals reviewed.    ED Treatments / Results  Labs (all labs ordered are listed, but only abnormal results are displayed) Labs Reviewed - No data to display  EKG  EKG Interpretation None       Radiology No results found.  Procedures .Marland Kitchen.Laceration Repair Date/Time: 05/26/2017 4:36 PM Performed by: Pauline AusRIPLETT, Izayiah Tibbitts Authorized by: Pauline AusRIPLETT, Sybella Harnish   Consent:    Consent obtained:  Verbal   Consent given by:  Patient   Risks discussed:  Poor cosmetic result, poor wound healing and infection Universal protocol:    Immediately prior to procedure, a time out was called: yes     Patient identity confirmed:  Verbally with patient and arm band Anesthesia (see MAR for exact dosages):    Anesthesia method:  Local infiltration   Local anesthetic:  Lidocaine 2% w/o epi Laceration details:    Location:  Lip   Lip location:  Upper exterior lip   Length (cm):  1.5 Repair type:    Repair type:  Simple Pre-procedure details:    Preparation:  Patient was prepped and draped in usual sterile fashion and imaging obtained to evaluate for foreign bodies Exploration:    Hemostasis achieved with:  Direct pressure   Wound exploration: entire depth of wound probed and visualized     Contaminated: no   Treatment:    Area cleansed with:  Betadine   Amount of cleaning:   Standard   Irrigation solution:  Sterile saline   Irrigation method:  Syringe Skin repair:    Repair method:  Sutures   Suture size:  6-0   Suture material:  Prolene   Suture technique:  Simple interrupted   Number of sutures:  3 Post-procedure details:    Dressing:  Open (no dressing)   Patient tolerance of procedure:  Tolerated well, no immediate complications   (including critical care time)  Medications Ordered in ED Medications  lidocaine (XYLOCAINE) 2 % injection 5 mL (not administered)  povidone-iodine (BETADINE) 10 % external solution (not administered)     Initial Impression / Assessment and Plan / ED Course  I have reviewed the triage vital signs and the nursing notes.  Pertinent labs & imaging results  that were available during my care of the patient were reviewed by me and considered in my medical decision making (see chart for details).     Small lac superior to upper left lip.  Bleeding controlled, lac does not extend to the vermillion border.   TD is up to date per patient.  No dental injury.  Wound care instructions discussed.  Sutures out in 5-7 days.  Return precautions discussed.   Final Clinical Impressions(s) / ED Diagnoses   Final diagnoses:  Facial laceration, initial encounter    New Prescriptions New Prescriptions   No medications on file     Pauline Aus, Cordelia Poche 05/28/17 2309    Mesner, Barbara Cower, MD 05/29/17 [phone]

## 2017-05-26 NOTE — Discharge Instructions (Signed)
Clean the wound with mild soap and water.  Apply a small amt of vaseline twice a day.  Sutures out in 5-7 days.  Tylenol or ibuprofen if needed for pain

## 2017-05-27 ENCOUNTER — Encounter (INDEPENDENT_AMBULATORY_CARE_PROVIDER_SITE_OTHER): Payer: Self-pay | Admitting: Orthopedic Surgery

## 2017-05-27 ENCOUNTER — Ambulatory Visit (INDEPENDENT_AMBULATORY_CARE_PROVIDER_SITE_OTHER): Payer: 59 | Admitting: Orthopedic Surgery

## 2017-05-27 DIAGNOSIS — M25512 Pain in left shoulder: Secondary | ICD-10-CM

## 2017-05-27 DIAGNOSIS — G8929 Other chronic pain: Secondary | ICD-10-CM | POA: Diagnosis not present

## 2017-05-27 MED ORDER — IBUPROFEN-FAMOTIDINE 800-26.6 MG PO TABS: 1.0000 | ORAL_TABLET | Freq: Two times a day (BID) | ORAL | 0 refills | Status: AC | PRN

## 2017-05-27 NOTE — Progress Notes (Signed)
Office Visit Note   Patient: Paul Lynch           Date of Birth: 10-11-1975           MRN: [id number] Visit Date: 05/27/2017 Requested by: No referring provider defined for this encounter. PCP: Patient, No Pcp Per  Subjective: Chief Complaint  Patient presents with  . Left Shoulder - Follow-up    HPI: Case is a 41 year old patient with left shoulder pain.  He has a history of chronic left shoulder pain with multiple injuries since February.  He describes 1 injury where he was pushing along with his left arm.  Felt some popping in the shoulder and this happened in June.  Currently he describes significant pain and limitation of motion in the shoulder.  He has had an MRI scan which is non-arthrogram.  Rotator cuff intact and there is suggestion of possible labral abnormalities in the superior labrum and posterior inferior labrum.  He has not reported any instability episodes.  MRI scan is reviewed with the patient.  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  That study is also reviewed with the patient.  He's been taking gabapentin and prednisone.  He would like to stop taking prednisone because of the weight gain.  He is right-hand-dominant.              ROS: All systems reviewed are negative as they relate to the chief complaint within the history of present illness.  Patient denies  fevers or chills.   Assessment & Plan: Visit Diagnoses:  1. Chronic left shoulder pain     Plan: Impression is left frozen shoulder.  Rotator cuff strength is intact but his main issue now is significant limitation of passive motion left shoulder versus right.  MRI scan also is consistent with adhesive capsulitis with relative obliteration of the axillary recess.  Plan is to put him on Duexis which is an anti-inflammatory with some GI protective's.  Also start physical therapy in [place] for range of motion and strengthening.  Out of  work for 3 months least until this is resolved particularly in conjunction with his cervical spine issues.  I'll see him back in 6 weeks for clinical reassessment and we may consider intra-articular injection at that time.  Follow-Up Instructions: Return in about 6 weeks (around 07/08/2017).   Orders:  Orders Placed This Encounter  Procedures  . Ambulatory referral to Physical Therapy   Meds ordered this encounter  Medications  . Ibuprofen-Famotidine (DUEXIS) 800-26.6 MG TABS    Sig: Take 1 tablet by mouth 2 (two) times daily as needed.    Dispense:  60 tablet    Refill:  0      Procedures: No procedures performed   Clinical Data: No additional findings.  Objective: Vital Signs: There were no vitals taken for this visit.  Physical Exam:   Constitutional: Patient appears well-developed HEENT:  Head: Normocephalic Eyes:EOM are normal Neck: Normal range of motion Cardiovascular: Normal rate Pulmonary/chest: Effort normal Neurologic: Patient is alert Skin: Skin is warm Psychiatric: Patient has normal mood and affect    Ortho Exam: Orthopedic exam demonstrates some pain with rotation of the head to the left and right.  He has 5 out of 5 grip EPL FPL interosseous wrist flexion and wrist extension strength biceps a little weaker on the left compared to the right the triceps strength is 5+ out of 5 bilateral.  No definite paresthesias  in the arms on examination but he does report paresthesias running down the whole left arm into the forearm and hand.  This is on the dorsal and volar surface of the forearm.  Shoulder examination demonstrates forward flexion on the right to 180 forward flexion on the left to 100 passively.  External rotation at 15 abduction is 60 on the right and 25 on the left with painful end point.  Isolated glenohumeral abduction 120 on the right and about 80 on the left.  No acromioclavicular joint tenderness is noted.  Specialty Comments:  No specialty  comments available.  Imaging: No results found.   PMFS History: There are no active problems to display for this patient.  Past Medical History:  Diagnosis Date  . Bursitis   . Fractures     No family history on file.  Past Surgical History:  Procedure Laterality Date  . FRACTURE SURGERY     Social History   Occupational History  . Not on file.   Social History Main Topics  . Smoking status: Current Every Day Smoker    Packs/day: 0.25    Types: Cigarettes  . Smokeless tobacco: Never Used  . Alcohol use Yes     Comment: occasional  . Drug use: No  . Sexual activity: Not on file

## 2017-05-28 NOTE — Addendum Note (Signed)
Addended byPrescott Parma: Iosefa Weintraub on: 05/28/2017 09:39 AM   Modules accepted: Orders

## 2017-06-04 ENCOUNTER — Encounter (HOSPITAL_COMMUNITY): Payer: Self-pay

## 2017-06-04 ENCOUNTER — Ambulatory Visit (HOSPITAL_COMMUNITY): Payer: 59 | Attending: Orthopedic Surgery

## 2017-06-04 DIAGNOSIS — G8929 Other chronic pain: Secondary | ICD-10-CM | POA: Insufficient documentation

## 2017-06-04 DIAGNOSIS — M25512 Pain in left shoulder: Secondary | ICD-10-CM | POA: Insufficient documentation

## 2017-06-04 DIAGNOSIS — R29898 Other symptoms and signs involving the musculoskeletal system: Secondary | ICD-10-CM | POA: Insufficient documentation

## 2017-06-04 DIAGNOSIS — M25612 Stiffness of left shoulder, not elsewhere classified: Secondary | ICD-10-CM | POA: Insufficient documentation

## 2017-06-04 NOTE — Patient Instructions (Signed)
Complete 3-5 times a day.  Repeat all exercises 10-15 times 1 set  1) Shoulder Protraction    Begin with elbows by your side, slowly "punch" straight out in front of you   2) Shoulder Flexion  Supine:             Begin with arms at your side with thumbs pointed up, slowly raise both arms up and forward towards overhead.      3) Horizontal abduction/adduction  Supine:              Begin with arms straight out in front of you, bring out to the side in at "T" shape. Keep arms straight entire time.         4) Internal & External Rotation    *No band* -Stand with elbows at the side and elbows bent 90 degrees. Move your forearms away from your body, then bring back inward toward the body.     5) Shoulder Abduction  Supine:           Lying on your back begin with your arms flat on the table next to your side. Slowly move your arms out to the side so that they go overhead, in a jumping jack or snow angel movement.        SHOULDER: Flexion On Table   Place hands on table, elbows straight. Move hips away from body. Press hands down into table. Hold _2__ seconds. __10-15_ reps per set  Abduction (Passive)   With arm out to side, resting on table, lower head toward arm, keeping trunk away from table. Hold _2___ seconds. Repeat __10-15__ times. Do ____ sessions per day.  Copyright  VHI. All rights reserved.     Internal Rotation (Assistive)   Seated with elbow bent at right angle and held against side, slide arm on table surface in an inward arc. Repeat __10-15__ times. Do ____ sessions per day. Activity: Use this motion to brush crumbs off the table.  Copyright  VHI. All rights reserved.

## 2017-06-04 NOTE — Therapy (Addendum)
[place], [place], [address] Phone: [phone]   Fax:  [phone]  Occupational Therapy Evaluation/Discharge  Patient Details  Name: Paul Lynch MRN: [id number] Date of Birth: [birth year] Referring Provider: Dr. Meredith Pel  Encounter Date: 06/04/2017      OT End of Session - 06/04/17 1303    Visit Number 1   Number of Visits 12   Date for OT Re-Evaluation 08/03/17   Authorization Type [employer] $50 copay   Authorization Time Period 30 visits per year. 0 used   Authorization - Visit Number 1   Authorization - Number of Visits 30   OT Start Time [phone]   OT Stop Time 0948   OT Time Calculation (min) 41 min   Activity Tolerance Patient tolerated treatment well   Behavior During Therapy [facility] for tasks assessed/performed      Past Medical History:  Diagnosis Date  . Bursitis   . Fractures     Past Surgical History:  Procedure Laterality Date  . FRACTURE SURGERY      There were no vitals filed for this visit.      Subjective Assessment - 06/04/17 0910    Subjective  S: I've had multiple injuries.    Pertinent History patient is a 41 y/o male S/P chronic left shoulder pain which has stemmed from several previous injuries to his shoulder. He has a history of chronic left shoulder pain with multiple injuries since February. Felt some popping in the shoulder and this happened in June He has had an MRI scan which is non-arthrogram.  Rotator cuff intact and there is suggestion of possible labral abnormalities in the superior labrum and posterior inferior labrum. Dr. Marlou Sa has referred him for occupational therapy and treatment.     Special Tests FOTO score: 51/100   Patient Stated Goals To get his arm back to normal.    Currently in Pain? Yes  Right now: 2/10   Pain Score --  Pain varies from 1-10    Pain Location Shoulder   Pain Orientation Left   Pain Descriptors / Indicators  Constant;Burning;Pressure   Pain Type Acute pain   Pain Radiating Towards shoulder down arm and up neck   Pain Onset More than a month ago   Pain Frequency Constant   Aggravating Factors  Heavy lifting, use and movement   Pain Relieving Factors Nothing at this time. Ice makes it worse. Heat helps somewhat.   Effect of Pain on Daily Activities Max effect   Multiple Pain Sites No           [facility] OT Assessment - 06/04/17 0912      Assessment   Diagnosis Chronic left shoulder pain   Referring Provider Dr. Meredith Pel   Onset Date 10/24/16  Pain became severe 4 months ago   Assessment 07/09/17 follow up   Prior Therapy None     Precautions   Precautions None     Restrictions   Weight Bearing Restrictions No     Balance Screen   Has the patient fallen in the past 6 months No     Home  Environment   Family/patient expects to be discharged to: Private residence     Prior Function   Level of Independence Independent   Vocation Full time employment   Vocation Requirements Off till December - [employer]. ([place])      ADL   ADL comments Difficulty with normal daily tasks.  pain felt with reaching out, up, and out to the side. Unable to lift normal heavy items.      Mobility   Mobility Status Independent     Written Expression   Dominant Hand Right     Vision - History   Baseline Vision No visual deficits     ROM / Strength   AROM / PROM / Strength AROM;PROM;Strength     Palpation   Palpation comment Max fascial restrictions in left upper arm, trapezius, and  lateral cervical region.     AROM   Overall AROM Comments Assessed seated. IR/er adducted.    AROM Assessment Site Shoulder   Right/Left Shoulder Left   Left Shoulder Flexion 96 Degrees   Left Shoulder ABduction 61 Degrees  burning pain   Left Shoulder Internal Rotation 90 Degrees   Left Shoulder External Rotation 31 Degrees     PROM   Overall PROM Comments Assessed supine. IR/er  adducted.    PROM Assessment Site Shoulder   Right/Left Shoulder Left   Left Shoulder Flexion 130 Degrees   Left Shoulder ABduction 110 Degrees   Left Shoulder Internal Rotation 90 Degrees   Left Shoulder External Rotation 55 Degrees     Strength   Overall Strength Comments Assessed seated. IR/er adducted.   Strength Assessment Site Shoulder   Right/Left Shoulder Left   Left Shoulder Flexion 3-/5   Left Shoulder ABduction 3-/5   Left Shoulder Internal Rotation 3/5   Left Shoulder External Rotation 3-/5                  OT Treatments/Exercises (OP) - 06/04/17 1300      Exercises   Exercises Shoulder               OT Education - 06/04/17 1300    Education provided Yes   Education Details Table slides and supine A/ROM exercises.    Person(s) Educated Patient   Methods Explanation;Demonstration;Verbal cues;Handout   Comprehension Returned demonstration;Verbalized understanding          OT Short Term Goals - 06/04/17 1308      OT SHORT TERM GOAL #1   Title Patient will be educated and independent with HEP to increase functional use of LUE during daily tasks.    Time 3   Period Weeks   Status New   Target Date 07/02/17     OT SHORT TERM GOAL #2   Title Patient will increase P/ROM of LUE to WNL to increase ability to complete any task requiring him to reach out to the side, out, or at shoulder height.    Time 3   Period Weeks   Status New     OT SHORT TERM GOAL #3   Title Patient will increase LUE strength to 3+/5 to increase ability to return to completing normal lightweight lifting tasks.    Time 3   Period Weeks   Status New     OT SHORT TERM GOAL #4   Title Patient will decrease pain level during daily tasks with LUE to 4/10.   Time 3   Period Weeks   Status New     OT SHORT TERM GOAL #5   Title Patient will decrease fascial restrictions in LUE from max amount to mod amount to increase functional mobility needed to complete reaching  tasks.    Time 3   Period Weeks   Status New           OT Long Term Goals -  06/04/17 1311      OT LONG TERM GOAL #1   Title Patient will return to highest level of independence with LUE while using it for all daily and work tasks.   Time 6   Period Weeks   Status New   Target Date 07/16/17     OT LONG TERM GOAL #2   Title Patient will increase LUE strength to 4+/5 to begin to completing normal lifting tasks at home.    Time 6   Period Weeks   Status New     OT LONG TERM GOAL #3   Title Patient will increase A/ROM of LUE to [facility] to increase ability to complete tasks overhead with more comfort.    Time 6   Period Weeks   Status New     OT LONG TERM GOAL #4   Title Patient will decrease pain in LUE during daily use to 2/10 or less.    Time 6   Period Weeks   Status New     OT LONG TERM GOAL #5   Title Patient will decrease fascial restrictions in LUE to min amount or less to increase functional mobility needed for reaching tasks.    Time 6   Period Weeks   Status New               Plan - 06/04/17 1305    Clinical Impression Statement A: Patient is a 41 y/o male S/P left shoulder pain causing increased fascial restrictions and decreased strength and ROM resulting in difficulty complet daily tasks with LUE.    Occupational Profile and client history currently impacting functional performance Age, motivated to return to [facility]   Occupational performance deficits (Please refer to evaluation for details): ADL's;IADL's;Rest and Sleep;Work;Leisure   Rehab Potential Excellent   Current Impairments/barriers affecting progress: high copay   OT Frequency 2x / week   OT Duration 6 weeks   OT Treatment/Interventions Self-care/ADL training;Therapeutic exercise;Patient/family education;Ultrasound;Manual Therapy;Cryotherapy;Electrical Stimulation;Moist Heat;Passive range of motion;DME and/or AE instruction;Therapeutic activities;Iontophoresis   Plan P: patient will benefit from  skilled O T services to increase functional performance during daily and work related tasks using his LUE. Treatment Plan: myofascial release, manual strething, AA/ROM seated, A/ROM supine, progress to general shoulder and scapular strengthening.    Clinical Decision Making Limited treatment options, no task modification necessary   OT Home Exercise Plan 9/13: table slides and supine A/ROM    Consulted and Agree with Plan of Care Patient      Patient will benefit from skilled therapeutic intervention in order to improve the following deficits and impairments:  Decreased strength, Pain, Decreased range of motion, Increased fascial restricitons, Impaired UE functional use  Visit Diagnosis: Other symptoms and signs involving the musculoskeletal system - Plan: Ot plan of care cert/re-cert  Chronic left shoulder pain - Plan: Ot plan of care cert/re-cert  Stiffness of left shoulder, not elsewhere classified - Plan: Ot plan of care cert/re-cert    Problem List There are no active problems to display for this patient.  Ailene Ravel, OTR/L,CBIS  [phone]  06/04/2017, 1:17 PM  [place] [address] [place], [place], [address] Phone: [phone]   Fax:  [phone]  Name: Shaheim Mahar MRN: [id number] Date of Birth: 1976/03/31    OCCUPATIONAL THERAPY DISCHARGE SUMMARY  Visits from Start of Care: 1  Current functional level related to goals / functional outcomes: Unknown. Pt has not returned since evaluation, therefore is being discharged from OT  services.    Remaining deficits: unknown   Education / Equipment: Table slides and supine A/ROM at evaluation  Plan: Patient agrees to discharge.  Patient goals were not met. Patient is being discharged due to not returning since the last visit.  ?????

## 2017-06-10 ENCOUNTER — Ambulatory Visit (HOSPITAL_COMMUNITY): Payer: 59

## 2017-06-12 ENCOUNTER — Ambulatory Visit (HOSPITAL_COMMUNITY): Payer: 59 | Admitting: Occupational Therapy

## 2017-06-12 ENCOUNTER — Telehealth (HOSPITAL_COMMUNITY): Payer: Self-pay | Admitting: Occupational Therapy

## 2017-06-12 NOTE — Telephone Encounter (Signed)
He is out of town and can't make it today

## 2017-06-16 ENCOUNTER — Ambulatory Visit (HOSPITAL_COMMUNITY): Payer: 59

## 2017-06-16 ENCOUNTER — Telehealth (HOSPITAL_COMMUNITY): Payer: Self-pay | Admitting: General Practice

## 2017-06-16 ENCOUNTER — Encounter (HOSPITAL_COMMUNITY): Payer: Self-pay

## 2017-06-16 NOTE — Telephone Encounter (Signed)
06/16/17  fiance cx said that he was still out of town and she will call back about the 9/28 appt.

## 2017-06-19 ENCOUNTER — Telehealth (HOSPITAL_COMMUNITY): Payer: Self-pay

## 2017-06-19 ENCOUNTER — Ambulatory Visit (HOSPITAL_COMMUNITY): Payer: 59

## 2017-06-19 NOTE — Telephone Encounter (Signed)
Called patient regarding no show. Left message letting patient that if he has another no show he will only be able to schedule one appointment at a time. Reminded patient of next appointment and to call if he can't make it.  Limmie Patricia, OTR/L,CBIS  [phone]

## 2017-06-22 ENCOUNTER — Ambulatory Visit (HOSPITAL_COMMUNITY): Payer: 59 | Attending: Orthopedic Surgery

## 2017-06-26 ENCOUNTER — Telehealth (HOSPITAL_COMMUNITY): Payer: Self-pay | Admitting: Occupational Therapy

## 2017-06-26 ENCOUNTER — Ambulatory Visit (HOSPITAL_COMMUNITY): Payer: 59 | Admitting: Occupational Therapy

## 2017-06-26 NOTE — Telephone Encounter (Signed)
Pt is still out of town, he will call us back to R/S when He knows he will be in town.

## 2017-06-30 ENCOUNTER — Encounter (HOSPITAL_COMMUNITY): Payer: 59

## 2017-07-03 ENCOUNTER — Encounter (HOSPITAL_COMMUNITY): Payer: 59 | Admitting: Occupational Therapy

## 2017-07-07 ENCOUNTER — Encounter (HOSPITAL_COMMUNITY): Payer: 59

## 2017-07-09 ENCOUNTER — Encounter (INDEPENDENT_AMBULATORY_CARE_PROVIDER_SITE_OTHER): Payer: Self-pay

## 2017-07-09 ENCOUNTER — Ambulatory Visit (INDEPENDENT_AMBULATORY_CARE_PROVIDER_SITE_OTHER): Payer: 59 | Admitting: Orthopedic Surgery

## 2017-07-10 ENCOUNTER — Encounter (HOSPITAL_COMMUNITY): Payer: 59 | Admitting: Occupational Therapy

## 2017-07-13 ENCOUNTER — Ambulatory Visit (INDEPENDENT_AMBULATORY_CARE_PROVIDER_SITE_OTHER): Payer: 59 | Admitting: Orthopedic Surgery

## 2017-07-13 ENCOUNTER — Encounter (INDEPENDENT_AMBULATORY_CARE_PROVIDER_SITE_OTHER): Payer: Self-pay

## 2017-07-14 ENCOUNTER — Encounter (HOSPITAL_COMMUNITY): Payer: 59 | Admitting: Occupational Therapy

## 2017-07-17 ENCOUNTER — Encounter (HOSPITAL_COMMUNITY): Payer: 59 | Admitting: Occupational Therapy

## 2017-07-20 ENCOUNTER — Encounter (INDEPENDENT_AMBULATORY_CARE_PROVIDER_SITE_OTHER): Payer: Self-pay

## 2017-07-20 ENCOUNTER — Ambulatory Visit (INDEPENDENT_AMBULATORY_CARE_PROVIDER_SITE_OTHER): Payer: 59 | Admitting: Orthopedic Surgery

## 2017-07-21 ENCOUNTER — Encounter (HOSPITAL_COMMUNITY): Payer: 59 | Admitting: Occupational Therapy

## 2017-07-23 ENCOUNTER — Encounter (HOSPITAL_COMMUNITY): Payer: 59 | Admitting: Occupational Therapy

## 2018-02-01 IMAGING — MR MR SHOULDER*L* W/O CM
4 of 5 series · 25 of 40 positions shown · non-contrast
Comparison: Radiographs from 02/26/2017

CLINICAL DATA: Right shoulder pain for 6 months.  Neck pain.

EXAM:
MRI OF THE LEFT SHOULDER WITHOUT CONTRAST
TECHNIQUE: Multiplanar, multisequence MR imaging of the shoulder was performed.
No intravenous contrast was administered.

[Series 3: t2fs axial blade · axial · 3.0mm · 0.55mm/px · z∈[-14,+46]mm · 6 of 22 slices shown]
[im 1/22]
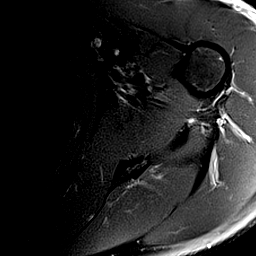
[im 4/22]
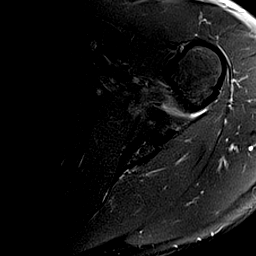
[im 8/22]
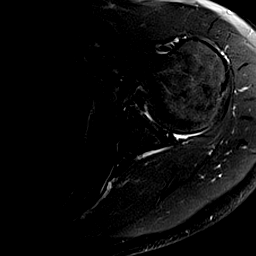
[im 11/22]
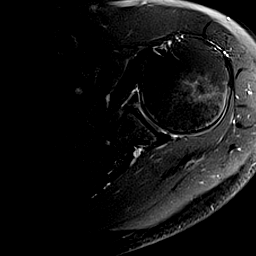
[im 15/22]
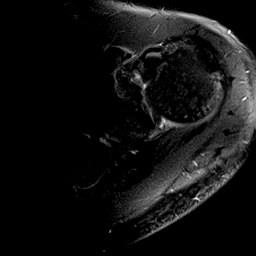
[im 18/22]
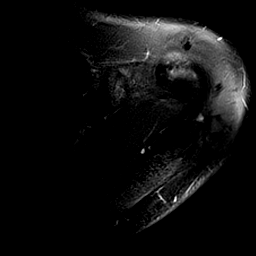

[Series 4: t2fs_blade_cor · oblique · 3.0mm · 0.47mm/px · 3 of 22 slices shown]
[im 4/22]
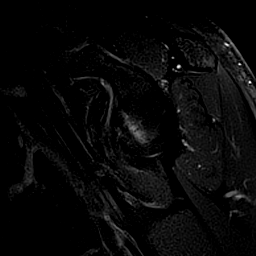
[im 13/22]
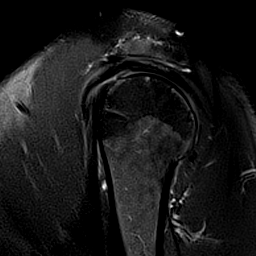
[im 19/22]
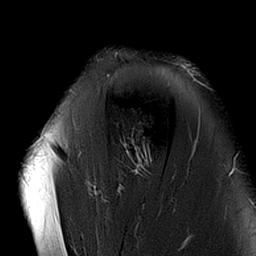

[Series 5: PD · oblique · 3.0mm · 0.24mm/px · 7 of 20 slices shown]
[im 1/20]
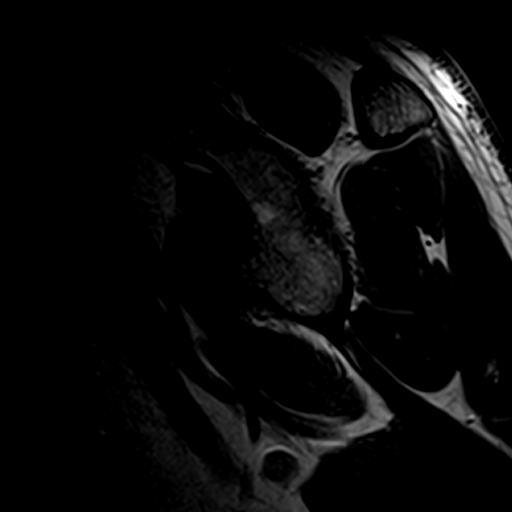
[im 4/20]
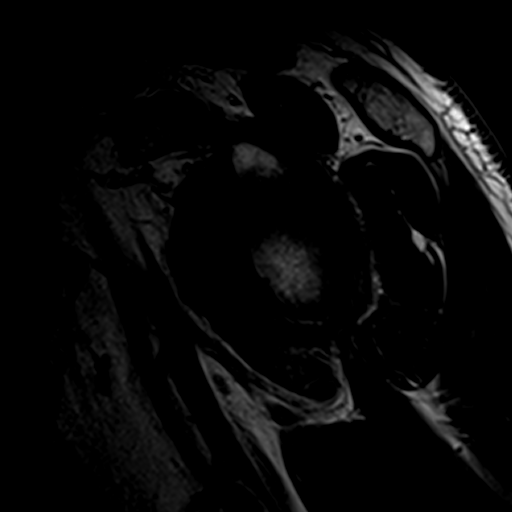
[im 7/20]
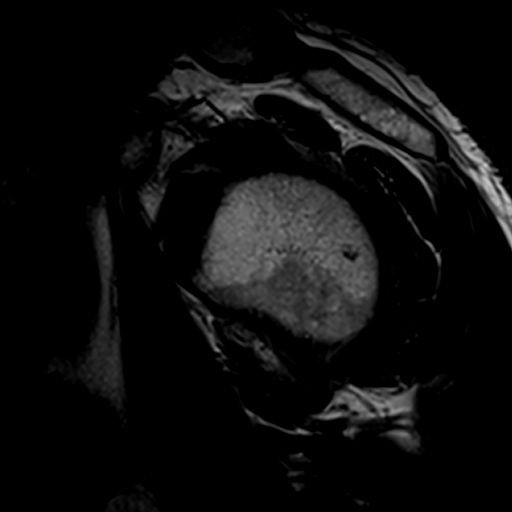
[im 10/20]
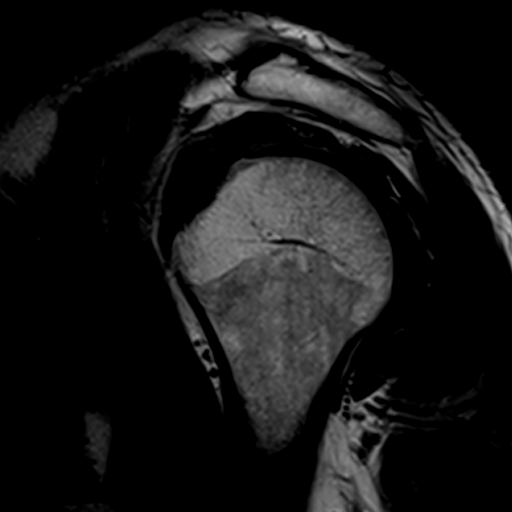
[im 13/20]
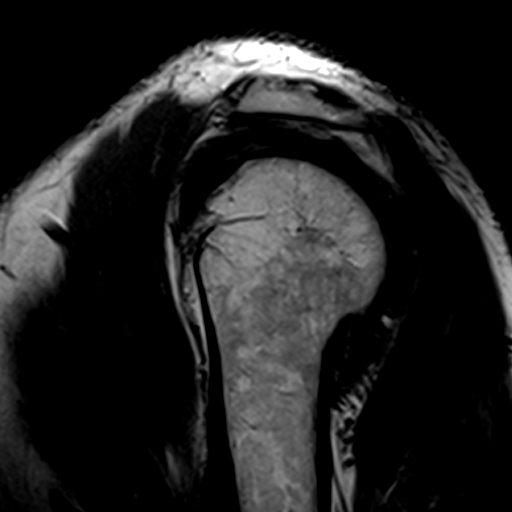
[im 16/20]
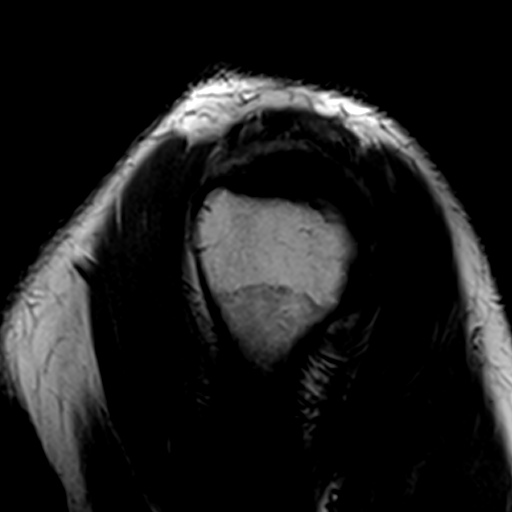
[im 20/20]
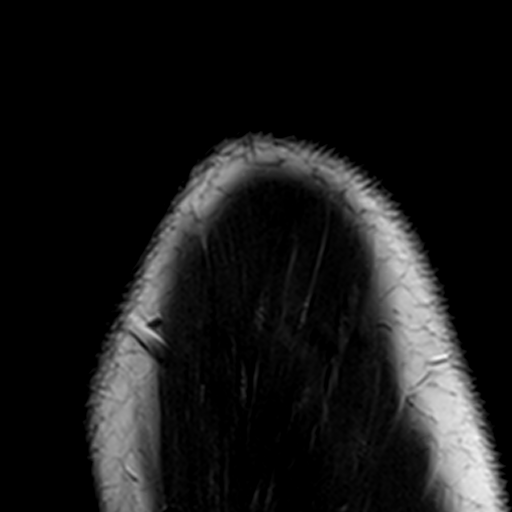

[Series 6: T1 · oblique · 3.0mm · 0.29mm/px · 9 of 24 slices shown]
[im 1/24]
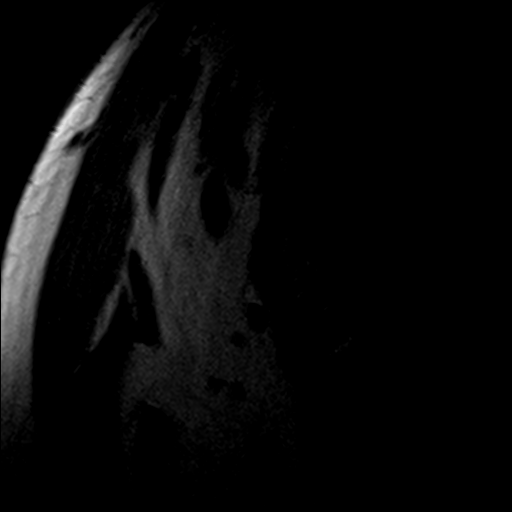
[im 3/24]
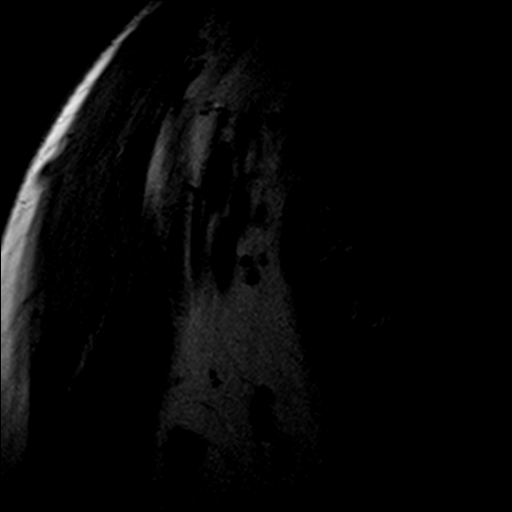
[im 6/24]
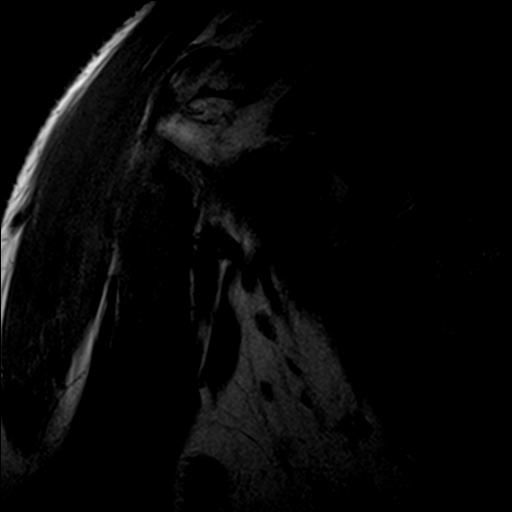
[im 9/24]
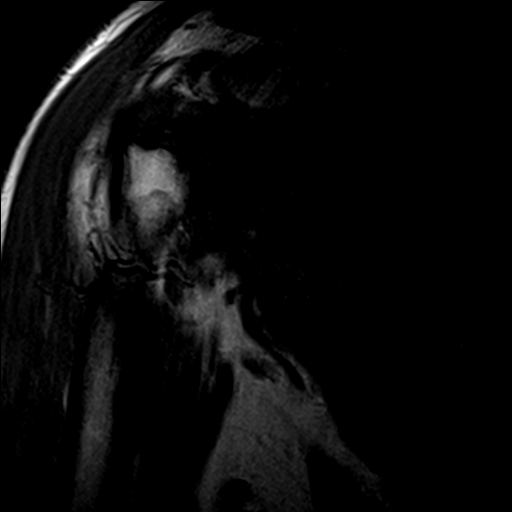
[im 12/24]
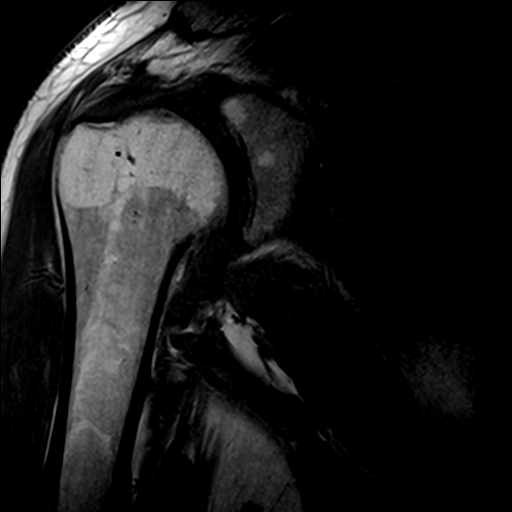
[im 15/24]
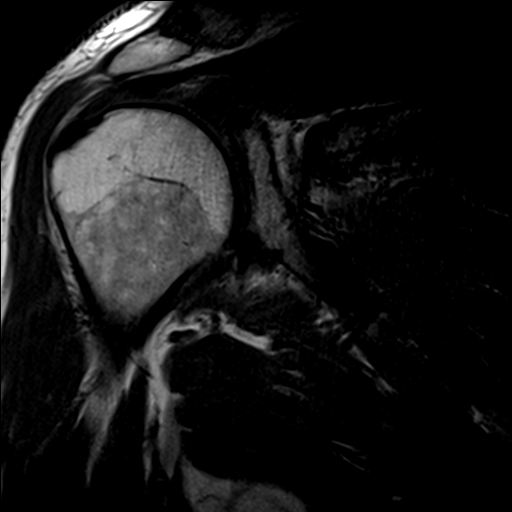
[im 18/24]
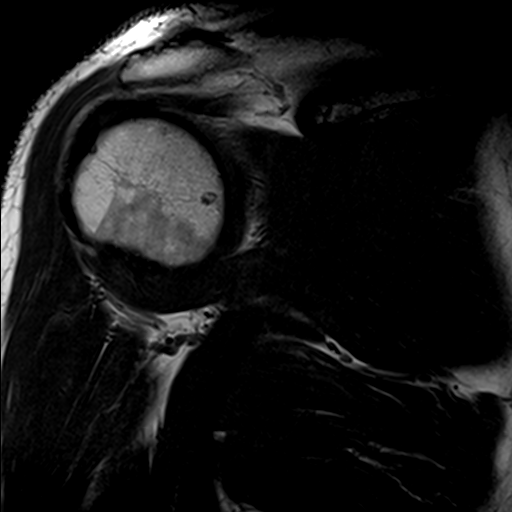
[im 21/24]
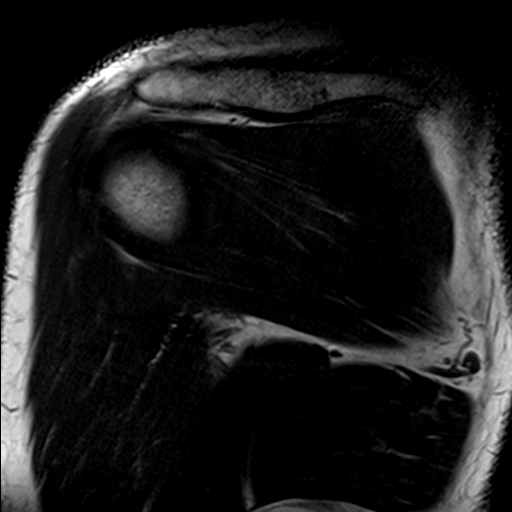
[im 24/24]
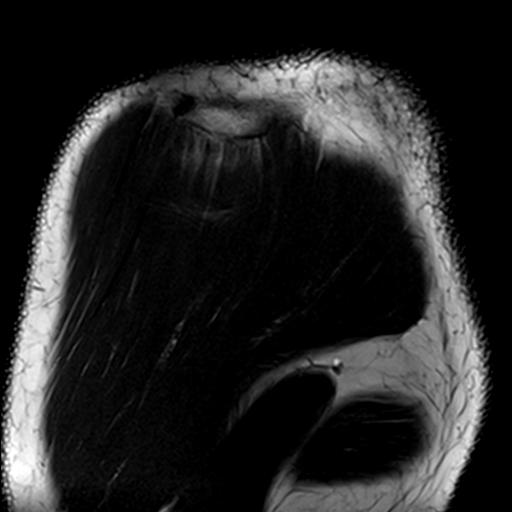

[25 of 40 positions shown; findings below may reference images not displayed]

FINDINGS: Despite efforts by the technologist and patient, motion artifact is
present on today's exam and could not be eliminated. This reduces
exam sensitivity and specificity.

Rotator cuff: Notable partial thickness articular surface tearing of
the supraspinatus tendon involving the anterior and midportion of
the tendon, images 11 through 14 series 7. Mild supraspinatus and
moderate subscapularis tendinopathy.

Muscles:  Unremarkable

Biceps long head:  Mild tendinopathy of the intra-articular segment.

Acromioclavicular Joint: Mild spurring and minimal subcortical
marrow edema. Type I acromion. Trace subacromial subdeltoid
bursitis.

Glenohumeral Joint: Edema signal in the rotator interval and
tracking just below the coracoid with a subtle amount of marrow
edema in the coracoid itself as on image [DATE]. Mild degenerative
chondral thinning. No joint effusion.

Labrum: Linear accentuated abnormal signal in the posterior inferior
labrum compatible with labral tear. Irregular linear accentuated
signal in the superior labrum may reflect a nondisplaced superior
labral tear is well.

Bones: The subtle edema in the coracoid adjacent to what is believed
to be synovitis. The adjacent short head of the biceps appears
intact. Edema tracks along the coracohumeral ligament.

Other: No supplemental non-categorized findings.
IMPRESSION: 1. Synovitis along the rotator interval with subcoracoid edema
suspicious for adhesive capsulitis.
2. Insertional partial thickness articular surface tearing of the
supraspinatus tendon.
3. Moderate subscapularis and mild supraspinatus and biceps
tendinopathy.
4. Mild degenerative chondral thinning in the glenohumeral joint
with mild degenerative AC joint arthropathy and trace subacromial
subdeltoid bursitis.
5. Tear in the posterior inferior glenoid labrum. Potential
nondisplaced tear in the superior labrum.

## 2019-07-27 DIAGNOSIS — M549 Dorsalgia, unspecified: Principal | ICD-10-CM

## 2019-08-10 ENCOUNTER — Encounter: Admit: 2019-08-10 | Discharge: 2019-08-11 | Payer: PRIVATE HEALTH INSURANCE

## 2019-08-10 ENCOUNTER — Ambulatory Visit: Admit: 2019-08-10 | Discharge: 2019-08-11 | Payer: PRIVATE HEALTH INSURANCE

## 2019-08-10 MED ORDER — PREGABALIN 50 MG CAPSULE
ORAL_CAPSULE | Freq: Three times a day (TID) | ORAL | 2 refills | 30 days | Status: CP
Start: 2019-08-10 — End: 2019-11-08

## 2019-08-10 MED ORDER — MELOXICAM 15 MG TABLET
ORAL_TABLET | Freq: Every day | ORAL | 2 refills | 30.00000 days | Status: CP
Start: 2019-08-10 — End: 2019-11-08

## 2019-08-25 ENCOUNTER — Encounter: Admit: 2019-08-25 | Discharge: 2019-08-25 | Payer: PRIVATE HEALTH INSURANCE

## 2019-08-25 DIAGNOSIS — M4802 Spinal stenosis, cervical region: Principal | ICD-10-CM

## 2019-11-09 ENCOUNTER — Encounter: Admit: 2019-11-09 | Discharge: 2019-11-09 | Payer: PRIVATE HEALTH INSURANCE

## 2019-11-09 DIAGNOSIS — Z1159 Encounter for screening for other viral diseases: Principal | ICD-10-CM

## 2019-11-09 DIAGNOSIS — M4802 Spinal stenosis, cervical region: Principal | ICD-10-CM

## 2019-11-09 DIAGNOSIS — G8929 Other chronic pain: Principal | ICD-10-CM

## 2019-11-09 DIAGNOSIS — M545 Low back pain: Principal | ICD-10-CM

## 2019-11-09 DIAGNOSIS — M2550 Pain in unspecified joint: Principal | ICD-10-CM

## 2019-11-14 DIAGNOSIS — Z111 Encounter for screening for respiratory tuberculosis: Principal | ICD-10-CM

## 2020-01-02 ENCOUNTER — Telehealth: Admit: 2020-01-02 | Discharge: 2020-01-03 | Payer: PRIVATE HEALTH INSURANCE

## 2020-01-02 MED ORDER — HUMIRA PEN CITRATE FREE 40 MG/0.4 ML
SUBCUTANEOUS | 6 refills | 56.00000 days | Status: CP
Start: 2020-01-02 — End: ?
  Filled 2020-01-11: qty 2, 28d supply, fill #0

## 2020-01-03 DIAGNOSIS — M459 Ankylosing spondylitis of unspecified sites in spine: Principal | ICD-10-CM

## 2020-01-09 MED ORDER — EMPTY CONTAINER
2 refills | 0 days
Start: 2020-01-09 — End: ?

## 2020-01-09 NOTE — Unmapped (Signed)
[facility]   Patient Onboarding/Medication Counseling    Jason Wagner is a 44 y.o. male with ankylosing spondylitis who I am counseling today on initiation of therapy.  I am speaking to the patient.    Was a Nurse, learning disability used for this call? No    Verified patient's date of birth / HIPAA.    Specialty medication(s) to be sent: Inflammatory Disorders: Humira      Non-specialty medications/supplies to be sent: sharps kit       Humira (adalimumab)    Medication & Administration     Dosage: Ankylosing spondylitis: Inject 40mg  under the skin every 14 days    Lab tests required prior to treatment initiation:  ??? Tuberculosis: Tuberculosis screening resulted in a non-reactive Quantiferon TB Gold assay.  ??? Hepatitis B: Hepatitis B serology studies are complete and non-reactive.    Administration:     Prefilled auto-injector pen  1. Gather all supplies needed for injection on a clean, flat working surface: medication pen removed from packaging, alcohol swab, sharps container, etc.  2. Look at the medication label - look for correct medication, correct dose, and check the expiration date  3. Look at the medication - the liquid visible in the window on the side of the pen device should appear clear and colorless  4. Lay the auto-injector pen on a flat surface and allow it to warm up to room temperature for at least 30-45 minutes  5. Select injection site - you can use the front of your thigh or your belly (but not the area 2 inches around your belly button); if someone else is giving you the injection you can also use your upper arm in the skin covering your triceps muscle  6. Prepare injection site - wash your hands and clean the skin at the injection site with an alcohol swab and let it air dry, do not touch the injection site again before the injection  7. Pull the 2 safety caps straight off - gray/white to uncover the needle cover and the plum cap to uncover the plum activator button, do not remove until immediately prior to injection and do not touch the white needle cover  8. Gently squeeze the area of cleaned skin and hold it firmly to create a firm surface at the selected injection site  9. Put the white needle cover against your skin at the injection site at a 90 degree angle, hold the pen such that you can see the clear medication window  10. Press down and hold the pen firmly against your skin, press the plum activator button to initiate the injection, there will be a click when the injection starts  11. Continue to hold the pen firmly against your skin for about 10-15 seconds - the window will start to turn solid yellow  12. To verify the injection is complete after 10-15 seconds, look and ensure the window is solid yellow and then pull the pen away from your skin  13. Dispose of the used auto-injector pen immediately in your sharps disposal container the needle will be covered automatically  14. If you see any blood at the injection site, press a cotton ball or gauze on the site and maintain pressure until the bleeding stops, do not rub the injection site    Adherence/Missed dose instructions:  If your injection is given more than 3 days after your scheduled injection date ??? consult your pharmacist for additional instructions on how to adjust your dosing schedule.  Goals of Therapy     - Relief of symptoms  - Maintenance of function  - Prevention of complications of spinal disease  - Minimization of extraspinal and extraarticular manifestations and comorbidities  - Maintenance of effective psychosocial functioning    Side Effects & Monitoring Parameters     ??? Injection site reaction (redness, irritation, inflammation localized to the site of administration)  ??? Signs of a common cold ??? minor sore throat, runny or stuffy nose, etc.  ??? Upset stomach  ??? Headache    The following side effects should be reported to the provider:  ??? Signs of a hypersensitivity reaction ??? rash; hives; itching; red, swollen, blistered, or peeling skin; wheezing; tightness in the chest or throat; difficulty breathing, swallowing, or talking; swelling of the mouth, face, lips, tongue, or throat; etc.  ??? Reduced immune function ??? report signs of infection such as fever; chills; body aches; very bad sore throat; ear or sinus pain; cough; more sputum or change in color of sputum; pain with passing urine; wound that will not heal, etc.  Also at a slightly higher risk of some malignancies (mainly skin and blood cancers) due to this reduced immune function.  o In the case of signs of infection ??? the patient should hold the next dose of Humira?? and call your primary care provider to ensure adequate medical care.  Treatment may be resumed when infection is treated and patient is asymptomatic.  ??? Changes in skin ??? a new growth or lump that forms; changes in shape, size, or color of a previous mole or marking  ??? Signs of unexplained bruising or bleeding ??? throwing up blood or emesis that looks like coffee grounds; black, tarry, or bloody stool; etc.  ??? Signs of new or worsening heart failure ??? shortness of breath; sudden weight gain; heartbeat that is not normal; swelling in the arms or legs that is new or worse      Contraindications, Warnings, & Precautions     ??? Have your bloodwork checked as you have been told by your prescriber  ??? Talk with your doctor if you are pregnant, planning to become pregnant, or breastfeeding  ??? Discuss the possible need for holding your dose(s) of Humira?? when a planned procedure is scheduled with the prescriber as it may delay healing/recovery timeline       Drug/Food Interactions     ??? Medication list reviewed in Epic. The patient was instructed to inform the care team before taking any new medications or supplements. No drug interactions identified.   ??? Talk with you prescriber or pharmacist before receiving any live vaccinations while taking this medication and after you stop taking it    Storage, Handling Precautions, & Disposal     ??? Store this medication in the refrigerator.  Do not freeze  ??? If needed, you may store at room temperature for up to 14 days  ??? Store in original packaging, protected from light  ??? Do not shake  ??? Dispose of used syringes/pens in a sharps disposal container        Current Medications (including OTC/herbals), Comorbidities and Allergies     Current Outpatient Medications   Medication Sig Dispense Refill   ??? acetaminophen (TYLENOL) 500 MG tablet Take 500 mg by mouth every six (6) hours as needed for pain.     ??? HUMIRA PEN CITRATE FREE 40 MG/0.4 ML Inject the contents of 1 pen (40 mg total) under the skin every fourteen (14) days.  4 each 6     No current facility-administered medications for this visit.       No Known Allergies    There is no problem list on file for this patient.      Reviewed and up to date in Epic.    Appropriateness of Therapy     Is medication and dose appropriate based on diagnosis? Yes    Prescription has been clinically reviewed: Yes    Baseline Quality of Life Assessment      Rheumatology:   Quality of Life    On a scale of 1 ??? 10 with 1 representing not at all and 10 representing completely ??? how has your rheumatologic condition affected your:         Financial Information     Medication Assistance provided: Prior Authorization    Anticipated copay of $3.00 reviewed with patient. Verified delivery address.    Delivery Information     Scheduled delivery date: 01/12/2020    Expected start date: TBD - patient is finishing up some antibiotics for a dental infection but is anticipating that course of antibiotics to be extended due to their symptoms; likely won't start for 2-3 weeks after drug shipment    Medication will be delivered via UPS to the prescription address in [place].  This shipment will not require a signature.      Explained the services we provide at [facility] and that each month we would call to set up refills.  Stressed importance of returning phone calls so that we could ensure they receive their medications in time each month.  Informed patient that we should be setting up refills 7-10 days prior to when they will run out of medication.  A pharmacist will reach out to perform a clinical assessment periodically.  Informed patient that a welcome packet and a drug information handout will be sent.      Patient verbalized understanding of the above information as well as how to contact the pharmacy at [phone] option 4 with any questions/concerns.  The pharmacy is open Monday through Friday 8:30am-4:30pm.  A pharmacist is available 24/7 via pager to answer any clinical questions they may have.    Patient Specific Needs     - Does the patient have any physical, cognitive, or cultural barriers? No    - Patient prefers to have medications discussed with  Patient     - Is the patient or caregiver able to read and understand education materials at a high school level or above? Yes    - Patient's primary language is  English     - Is the patient high risk? No     - Does the patient require a Care Management Plan? No     - Does the patient require physician intervention or other additional services (i.e. nutrition, smoking cessation, social work)? No      Karene Fry Laquinda Moller  [facility] Specialty Pharmacist

## 2020-01-09 NOTE — Unmapped (Signed)
[facility] Specialty Medication Onboarding    Specialty Medication: HUMIRA (CF) PENS 40MG  EVERY 2 WEEKS  Prior Authorization: Approved   Financial Assistance: No - copay  <$25  Final Copay/Day Supply: $3 / 28 DAYS    Insurance Restrictions: Yes - max 1 month supply     Notes to Pharmacist:     The triage team has completed the benefits investigation and has determined that the patient is able to fill this medication at [facility] [place]. Please contact the patient to complete the onboarding or follow up with the prescribing physician as needed.

## 2020-01-11 MED FILL — HUMIRA PEN CITRATE FREE 40 MG/0.4 ML: 28 days supply | Qty: 2 | Fill #0 | Status: AC

## 2020-01-11 MED FILL — EMPTY CONTAINER: 120 days supply | Qty: 1 | Fill #0 | Status: AC

## 2020-01-11 MED FILL — EMPTY CONTAINER: 120 days supply | Qty: 1 | Fill #0

## 2020-02-13 NOTE — Unmapped (Signed)
I spoke with Jason Wagner, Jason Wagner wife, this morning and she stated he is still being treated with antibiotics for a dental infection and that his course of therapy will continue into next week.  He will initiate his Humira after antibiotics are completed and she requests I call back in about a month's time to reassess the need for a refill at that point in time.    Rescheduling [name]'s next specialty pharmacy outreach call appropriately - the patient voiced understanding that they are to call us back at the [facility] if they need anything between now and then.

## 2020-03-19 NOTE — Unmapped (Signed)
I spoke with Jason Wagner, [name] wife, today and she states that his antibiotics had been extended and they have a follow up with the dentist in ~2 weeks.  She confirms they have his Humira in the refrigerator at home and if he's given the all-clear at that appointment he will begin his Humira injections.    [name] agreed for [place] to call them back in ~5 week's time to reassess the need for a refill of medication then.  Rescheduling [name] next specialty pharmacy outreach call appropriately - the patient's wife voiced understanding that they are to call us back at the [facility] if they need anything between now and then.

## 2020-04-26 MED ORDER — MELOXICAM 15 MG TABLET
ORAL_TABLET | 2 refills | 0 days
Start: 2020-04-26 — End: ?

## 2020-04-26 NOTE — Unmapped (Signed)
[facility] Pharmacist Intervention    Type of intervention: Medication administration counseling    Medication: Humira 40mg /0.67ml    Problem: Mr. Hernon has been delayed starting his Humira due to extended antibiotic course related to a dental infection.  His wife, Marcelino Duster, spoke to me today and requsted a refresher on injection technique training since it has been over 3 months since we initially reviewed the process.    Intervention: I re-counseled Mr. Lemma's wife on proper injection technique as she states she will be helping administer his Humira doses.  I referred her back to the original MyChart message I sent in April as well so that she could watch the video demonstration of the injection process as well.    Follow up needed: Mr. Babe still has his original shipment of Humira stored in his refrigerater at home, he has been cleared to start now that his antibiotic treatment has concluded.  Marcelino Duster states that they intend to start injections tomorrow or this weekend.  Rescheduling Mr.Elmore's next specialty pharmacy outreach call appropriately - the patient's wife voiced understanding that they are to call us back at the [facility] if they need anything between now and then.      Approximate time spent: 15 minutes    Karene Fry Rashonda Warrior   [facility] Specialty Pharmacist

## 2020-04-26 NOTE — Unmapped (Signed)
Patient last seen 07/2019.  In review of the chart, meloxicam was discontinued 10/2019 given the patient was a longer taking it.  Patient will require follow-up appointment for additional medication management.

## 2020-05-08 ENCOUNTER — Ambulatory Visit: Admit: 2020-05-08 | Discharge: 2020-05-09 | Payer: PRIVATE HEALTH INSURANCE

## 2020-05-08 DIAGNOSIS — M545 Low back pain: Principal | ICD-10-CM

## 2020-05-08 DIAGNOSIS — G8929 Other chronic pain: Secondary | ICD-10-CM

## 2020-05-08 DIAGNOSIS — M459 Ankylosing spondylitis of unspecified sites in spine: Principal | ICD-10-CM

## 2020-05-08 LAB — RED BLOOD CELL COUNT: Lab: 4.19 — ABNORMAL LOW

## 2020-05-08 LAB — COMPREHENSIVE METABOLIC PANEL
ALKALINE PHOSPHATASE: 95 U/L (ref 46–116)
ALT (SGPT): 12 U/L (ref 10–49)
ANION GAP: 3 mmol/L — ABNORMAL LOW (ref 5–14)
AST (SGOT): 14 U/L (ref <=34)
BLOOD UREA NITROGEN: 11 mg/dL (ref 9–23)
BUN / CREAT RATIO: 17
CALCIUM: 9.7 mg/dL (ref 8.7–10.4)
CHLORIDE: 109 mmol/L — ABNORMAL HIGH (ref 98–107)
CO2: 27.8 mmol/L (ref 20.0–31.0)
CREATININE: 0.63 mg/dL
EGFR CKD-EPI AA MALE: >90 mL/min/{1.73_m2} (ref >=60)
EGFR CKD-EPI NON-AA MALE: >90 mL/min/{1.73_m2} (ref >=60)
GLUCOSE RANDOM: 87 mg/dL (ref 70–179)
POTASSIUM: 4 mmol/L (ref 3.4–4.5)
PROTEIN TOTAL: 6.9 g/dL (ref 5.7–8.2)
SODIUM: 140 mmol/L (ref 135–145)

## 2020-05-08 LAB — CBC W/ AUTO DIFF
BASOPHILS ABSOLUTE COUNT: 0.1 10*9/L (ref 0.0–0.1)
BASOPHILS RELATIVE PERCENT: 0.8 %
EOSINOPHILS ABSOLUTE COUNT: 0.1 10*9/L (ref 0.0–0.7)
EOSINOPHILS RELATIVE PERCENT: 1.8 %
HEMATOCRIT: 37.4 % — ABNORMAL LOW (ref 38.0–50.0)
LYMPHOCYTES ABSOLUTE COUNT: 1.8 10*9/L (ref 0.7–4.0)
LYMPHOCYTES RELATIVE PERCENT: 24.1 %
MEAN CORPUSCULAR HEMOGLOBIN CONC: 34.6 g/dL (ref 30.0–36.0)
MEAN CORPUSCULAR HEMOGLOBIN: 31 pg (ref 26.0–34.0)
MEAN CORPUSCULAR VOLUME: 89.4 fL (ref 81.0–95.0)
MEAN PLATELET VOLUME: 6.5 fL — ABNORMAL LOW (ref 7.0–10.0)
MONOCYTES ABSOLUTE COUNT: 0.6 10*9/L (ref 0.1–1.0)
MONOCYTES RELATIVE PERCENT: 8.6 %
NEUTROPHILS ABSOLUTE COUNT: 4.9 10*9/L (ref 1.7–7.7)
NEUTROPHILS RELATIVE PERCENT: 64.7 %
PLATELET COUNT: 229 10*9/L (ref 150–450)
RED BLOOD CELL COUNT: 4.19 10*12/L — ABNORMAL LOW (ref 4.32–5.72)
RED CELL DISTRIBUTION WIDTH: 13.9 % (ref 12.0–15.0)
WBC ADJUSTED: 7.5 10*9/L (ref 3.5–10.5)

## 2020-05-08 LAB — C-REACTIVE PROTEIN: C reactive protein:MCnc:Pt:Ser/Plas:Qn:: 37 — ABNORMAL HIGH

## 2020-05-08 LAB — ERYTHROCYTE SEDIMENTATION RATE: Lab: 33 — ABNORMAL HIGH

## 2020-05-08 LAB — AST (SGOT): Aspartate aminotransferase:CCnc:Pt:Ser/Plas:Qn:: 14

## 2020-05-08 MED ORDER — MELOXICAM 15 MG TABLET
ORAL_TABLET | Freq: Every day | ORAL | 2 refills | 30 days | Status: CP
Start: 2020-05-08 — End: 2021-05-08

## 2020-05-08 NOTE — Unmapped (Addendum)
You were seen by [facility] today.      [facility]  [address], [address] Floor   [place], [place] [address]  Phone:  [phone]  Fax:  [phone]     Rheumatologist:  Dr. Ilsa Iha       Summary of Plan for 05/08/20    Diagnostic testing recommendations:  Labs today (in clinic or on the first floor of the [employer])    Therapeutic / Treatment recommendations:   I recommend the following medication changes:  Recommend continuing meloxicam 15 mg daily.  When you are cleared from your dental infectiosn, please start your Humira     Referrals and follow-up recommendations:  Please follow-up with local ophthalmologist; neurosurgeon as needed, dentistry   I have placed referral to [facility] to evaluate for inflammatory eye disease (uveitis) that could be related to your inflammatory arthritis (ankylosing spondylitis)    If you get sick with coronavirus (or are exposed to someone with coronavirus), please contact me immediately so that we can arrange for an infusion of monoclonal antibody, which has been proved to decrease risk of hospitalization and severe COVID19 disease.     When should I expect results?   Please note that it may take up to 21 days for results to return.  If you have not been notified by phone, mail or [facility] (if applicable) in 21 days, please contact our office at [phone] or via [facility] messaging [email or web address])     What do I do if I have questions or concerns after the visit?   If you have non-urgent questions, the best way to contact your provider is to send a MyChart message by visiting [email or web address].  You can also use MyChart to request refills and access test results.  Providers will do their best to respond within 2-3 business days, although it may take longer in some circumstances.  If you have immediate concerns, please contact our clinic by phone  at [phone].     Thank you for allowing [facility] to be involved in your care!

## 2020-05-08 NOTE — Unmapped (Signed)
Rheumatology Clinic  Visit      Assessment/Plan:    44 y.o. male smoker with prior history of chronic joint pain presenting for follow-up for newly diagnosed ankylosing spondylitis.     Last seen 12/2019     Currently on scheduled meloxicam. Was unable to start Humira as recommended given ongoing dental infections.  Reports recent injury to eye (reports getting metal in eye).  Evaluated by ophthalmology locally with unclear details but possible mention of inflammation related to his ankylosing spondylitis. Will request records but refer to [facility] ophthalmology in the meantime    - CBC w/ diff, CMP, ESR, CRP  - Continue meloxicam 15 mg daily; counseled GI protection  - Once dental work is complete; recommend starting Humira for his ankylosing spondylitis  - Counseled patient on the importance of smoking cessation  - Counseled patient regarding recommendation to receive the covid19 vaccine; patient is adamant that he will not receive this vaccine due to risk of genetic mutation, death, illness from the vaccine; counseled patient regarding the facts and his risks for severe covid19 should he contract Sars-CoV2.  Advised him to reach out to our office should he become infected (or have close contact with someone who is infected) as he would qualify for monoclonal Ab infusion, which has been demonstrated to reduce risk of hospitalization and severe Covid19 disease     I personally spent 40 minutes face-to-face and non-face-to-face in the care of this patient, which includes all pre, intra, and post visit time on the date of service.    RTC 4 months with [name] and 8 months with myself  ________________________________________________________________      Reason for Visit:  F/u neck and back pain, concern for left ear      HPI:    [name] is a 45 y.o. man with prior history of chronic joint pain presenting for follow-up for newly diagnosed ankylosing spondylitis.     Last seen 12/2019    At last visit, recommended to start on Humira 40 mg subcutaneous q2weeks given persistent inflammatory axial pain despite scheduled NSAIDs.     Interim History:    He says he has not been able to start the Humira.  He says he has been having issues recurrent dental infections. He is working with local dentist to extract 3 teeth.  He has been on antibiotics intermittently since his last visit for dental infections. He has consultation with oral surgeon next month    He notes he is cutting back on his smoking.  He hasn't been able to stop yet.     He says his joint pain is stable. He notes that he has had some numbness in his right great toe.  Denies weakness. No bowel or bladder changes. No saddle paresthesias.     He says that he has been taking meloxicam - he has been out for about 2 weeks.   No GI upset     No new skin rash.   He denies any new inflammation in his eye.  He says he had metal in his left eye - since then he has had vision issues (blurry vision and rings)       Rheumatologic History:    Established care in 10/2019  at which time patient endorsed several year history of back pain in stiffness with recent worsening of symptoms in upper thoracic and cervical spine.  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 Noted to have  severely limited Schober's and Occiput to Wall at that time. Lab work notable for mildly elevated ESR and CRP.  [id number] +.   ID Screen negative Quant gold, HIV, HCV and HBV serologies. Imaging notable for fusion of b/l sacroiliac joints, L1-L4 facet ankylosis. 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  Based on his history and presentation, high suspicion for ankylosing spondylitis and recommended trial of scheduled NSAID (meloxicam) but remains symptomatic.   Recommended patient start on anti-TNF (Humira) in 12/2019 but this was not started due to prolonged dental infection.     ROS: 14 systems were reviewed and are negative except for that mentioned in the HPI.     Allergies: Patient has no known allergies.    Past Medical History:   Diagnosis Date   ??? Arthritis     Waiting on test results from Rheumotologist appointment 2/17   ??? 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 Headache 2019-2020    Starting getting them after my neck got really bad.       Current Outpatient Medications on File Prior to Visit   Medication Sig Dispense Refill   ??? acetaminophen (TYLENOL) 500 MG tablet Take 500 mg by mouth every six (6) hours as needed for pain.     ??? empty container Misc Use as directed 1 each 2   ??? HUMIRA PEN CITRATE FREE 40 MG/0.4 ML Inject the contents of 1 pen (40mg ) under the skin every 14 days 4 each 6     No current facility-administered medications on file prior to visit.       PSHx: No change from prior visit     Family History   Problem Relation Age of Onset   ??? Cancer Maternal Grandfather         Brought on by diabetes   ??? Diabetes Maternal Grandfather         State 2 diabetes insulin dependent   ??? Cancer Mother         Lung and breast cancer   ??? Cancer Maternal Grandmother         Leukemia     Social History     Tobacco Use   ??? Smoking status: Current Every Day Smoker     Packs/day: 1.00     Years: 34.00     Pack years: 34.00     Types: Cigarettes     Start date: 06/23/1987   ??? Smokeless tobacco: Never Used   Substance Use Topics   ??? Alcohol use: Yes     Comment: 3-4 times a year   ??? Drug use: Never       Physical Exam:  Vitals:    05/08/20 1200   BP: 120/71   Pulse: 80   Temp: 36.4 ??C (97.5 ??F)       General:  Alert and interactive; does not sound in distress  Pulmonary:  Speaking in full sentences; no cough, no wheezing  Psych: Mood and affect appropriate and congruent

## 2020-05-21 NOTE — Unmapped (Signed)
I spoke with Mr. Mallick today and he states that his antibiotics had been extended and they have a follow up with the dentist in the next 7-10 days. He confirms they have his Humira in the refrigerator at home and if he's given the all-clear at that appointment he will begin his Humira injections.     Mr. Degen agreed for [place] to call them back in 4-6 weeks' time to reassess the need for a refill of medication then. Rescheduling Mr.Quaranta's next specialty pharmacy outreach call appropriately - the patient voiced understanding that they are to call us back at the [facility] if they need anything between now and then.

## 2020-06-28 NOTE — Unmapped (Signed)
I spoke with Jason Wagner today and he states that his infection has cleared and he is having his teeth pulled next week. He confirms he still has the Humira in the refrigerator at home and he will begin his Humira injections after he heals from this dental procedure.   ??  Jason Wagner agreed for [place] to call him back in approximately 2 weeks to reassess the need for a refill of medication. Rescheduling Jason Wagner's next specialty pharmacy outreach call appropriately - the patient voiced understanding that they are to call us back at the [facility] if they need anything between now and then.

## 2020-07-04 ENCOUNTER — Ambulatory Visit: Admit: 2020-07-04 | Payer: PRIVATE HEALTH INSURANCE

## 2020-07-11 NOTE — Unmapped (Signed)
I spoke with [name] today and he states that his tooth extraction procedure has been pushed back until 08/13/20 and he does not intend to start Humira until after healing from the procedure. He confirms they have his Humira in the refrigerator at home and when he's given the all-clear after the procedure he will begin his Humira injections.     [name] agreed for [place] to call them back in 4-6 weeks' time to reassess the need for a refill of medication then. Rescheduling [name]'s next specialty pharmacy outreach call appropriately - the patient voiced understanding that they are to call us back at the [facility] if they need anything between now and then.

## 2020-08-28 NOTE — Unmapped (Signed)
Jason Wagner reports resolution of his dental infection and plans on starting Humira in the next few days.  He declined any further counseling for the medication stating he remembers our previous conversation.  He agrees to a call in ~2-3 weeks to follow up and reassess the need for a refill at that point in time.    Rescheduling Jason Wagner's next specialty pharmacy outreach call appropriately - the patient voiced understanding that they are to call us back at the [facility] if they need anything between now and then.

## 2020-09-10 NOTE — Unmapped (Deleted)
Rheumatology Clinic  Visit      Assessment/Plan:    44 y.o. male smoker with prior history of chronic joint pain presenting for follow-up for newly diagnosed ankylosing spondylitis.     Last seen 04/2020 by Dr. Ilsa Wagner    ***    Currently on scheduled meloxicam. Was unable to start Humira as recommended given ongoing dental infections.  Reports recent injury to eye (reports getting metal in eye).  Evaluated by ophthalmology locally with unclear details but possible mention of inflammation related to his ankylosing spondylitis. Will request records but refer to [facility] ophthalmology in the meantime    - CBC w/ diff, CMP, ESR, CRP  - Continue meloxicam 15 mg daily; counseled GI protection  - Once dental work is complete; recommend starting Humira for his ankylosing spondylitis  - Counseled patient on the importance of smoking cessation  - Counseled patient regarding recommendation to receive the covid19 vaccine; patient is adamant that he will not receive this vaccine due to risk of genetic mutation, death, illness from the vaccine; counseled patient regarding the facts and his risks for severe covid19 should he contract Sars-CoV2.  Advised him to reach out to our office should he become infected (or have close contact with someone who is infected) as he would qualify for monoclonal Ab infusion, which has been demonstrated to reduce risk of hospitalization and severe Covid19 disease     F/U as scheduled in April with Dr. Ilsa Wagner    I personally spent *** minutes face-to-face and non-face-to-face in the care of this patient, which includes all pre, intra, and post visit time on the date of service.    ________________________________________________________________      Reason for Visit:  F/u neck and back pain, concern for left ear      HPI:    Jason Wagner is a 44 y.o. man with prior history of chronic joint pain presenting for follow-up for newly diagnosed ankylosing spondylitis.     Last seen 04/2020 by Dr. Ilsa Wagner    At last visit, recommended to start on Humira 40 mg subcutaneous q2weeks given persistent inflammatory axial pain despite scheduled NSAIDs.     Interim History:    Humira?    Eyes?  Records from eye doctor?  Never received any?  Think related to AS?    Dental infections?    Saw [facility]?    Back stiffness?    Fever?  Illness?  Infections?    covid vaccine?  Flu vaccine?      Rheumatologic History:    Established care in 10/2019  at which time patient endorsed several year history of back pain in stiffness with recent worsening of symptoms in upper thoracic and cervical spine.  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 Noted to have severely limited Schober's and Occiput to Wall at that time. Lab work notable for mildly elevated ESR and CRP.  HLAB27 +.   ID Screen negative Quant gold, HIV, HCV and HBV serologies. Imaging notable for fusion of b/l sacroiliac joints, L1-L4 facet ankylosis. 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  Based on his history and presentation, high suspicion for ankylosing spondylitis and recommended trial of scheduled NSAID (meloxicam) but remains symptomatic.   Recommended patient start on anti-TNF (Humira) in 12/2019 but this was not started due to prolonged dental infection.     ROS: 14 systems were reviewed and are negative except for that mentioned in the HPI.  Allergies: Patient has no known allergies.    Past Medical History:   Diagnosis Date   ??? Arthritis     Waiting on test results from Rheumotologist appointment 2/17   ??? 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 Headache 2019-2020    Starting getting them after my neck got really bad.       Current Outpatient Medications on File Prior to Visit   Medication Sig Dispense Refill   ??? acetaminophen (TYLENOL) 500 MG tablet Take 500 mg by mouth every six (6) hours as needed for pain. (Patient not taking: Reported on 05/08/2020)     ??? empty container Misc Use as directed (Patient not taking: Reported on 05/08/2020) 1 each 2   ??? HUMIRA PEN CITRATE FREE 40 MG/0.4 ML Inject the contents of 1 pen (40mg ) under the skin every 14 days (Patient not taking: Reported on 05/08/2020) 4 each 6   ??? meloxicam (MOBIC) 15 MG tablet Take 1 tablet (15 mg total) by mouth daily. 30 tablet 2     No current facility-administered medications on file prior to visit.       PSHx: No change from prior visit     Family History   Problem Relation Age of Onset   ??? Cancer Maternal Grandfather         Brought on by diabetes   ??? Diabetes Maternal Grandfather         State 2 diabetes insulin dependent   ??? Cancer Mother         Lung and breast cancer   ??? Cancer Maternal Grandmother         Leukemia     Social History     Tobacco Use   ??? Smoking status: Current Every Day Smoker     Packs/day: 1.00     Years: 34.00     Pack years: 34.00     Types: Cigarettes     Start date: 06/23/1987   ??? Smokeless tobacco: Never Used   Substance Use Topics   ??? Alcohol use: Yes     Comment: 3-4 times a year   ??? Drug use: Never       Physical Exam:***  There were no vitals filed for this visit.    GEN:  In no acute distress. Non toxic appearing   Head: No alopecia. [place]/AT.  Eyes: EOMI. PERRL. No conjunctival injection.   Lymph: No lymphadenopathy of cervical   Chest: Good respiratory effort with symmetrical chest expansion.  CTAB.  Cardiovascular: RRR, normal s1/s2, no murmurs, rubs, or gallops appreciated.  2+ distal pulses equal bilaterally.  MSK:                Hands: Non-tender. No swelling/synovitis. Full ROM. Able to make fist. Enlarged MCP joints, but no active tenosynovitis               Neck:  Severely reduced ROM with all directions in neck, TTP in lower c/upper t spine. Occiput to wall test of 10.5 cm               Wrists: Non-tender. No swelling/synovitis. Full ROM.               Elbows: Non-tender. No swelling/synovitis. Full ROM.               Back:  Schober test of 0 cm.               Knees: Non-tender. No  swelling/synovitis. Full ROM.    Neuro:  AAO x 3.  No focal neurologic deficits. Strength 5/5 bilaterally in upper and lower extremities.  Skin:  No rashes, nodules or dry skin noted.  Multiple tattoos noted on UEs b/l. No petichiae or purpura.     Psych: Normal mood and affect. Normal thought process.

## 2020-09-21 NOTE — Unmapped (Signed)
[facility]   Patient Onboarding/Medication Counseling    Jason Wagner is a 44 y.o. male with ankylosing spondylitis who I am counseling today on a delayed initiation of therapy.  I am speaking to the patient.    Was a Nurse, learning disability used for this call? No    Verified patient's date of birth / HIPAA.    Specialty medication(s) to be sent: Inflammatory Disorders: Humira      Non-specialty medications/supplies to be sent: sharps kit       Humira (adalimumab)    The patient declined counseling on medication administration, missed dose instructions, goals of therapy, side effects and monitoring parameters, warnings and precautions, drug/food interactions and storage, handling precautions, and disposal because they were counseled previously but had never intitiated therapy. The information in the declined sections below are for informational purposes only and was not discussed with patient.     Medication & Administration     Dosage: Ankylosing spondylitis: Inject 40mg  under the skin every 14 days    Lab tests required prior to treatment initiation:  ??? Tuberculosis: Tuberculosis screening resulted in a non-reactive Quantiferon TB Gold assay.  ??? Hepatitis B: Hepatitis B serology studies are complete and non-reactive.    Administration:     Prefilled auto-injector pen  1. Gather all supplies needed for injection on a clean, flat working surface: medication pen removed from packaging, alcohol swab, sharps container, etc.  2. Look at the medication label - look for correct medication, correct dose, and check the expiration date  3. Look at the medication - the liquid visible in the window on the side of the pen device should appear clear and colorless  4. Lay the auto-injector pen on a flat surface and allow it to warm up to room temperature for at least 30-45 minutes  5. Select injection site - you can use the front of your thigh or your belly (but not the area 2 inches around your belly button); if someone else is giving you the injection you can also use your upper arm in the skin covering your triceps muscle  6. Prepare injection site - wash your hands and clean the skin at the injection site with an alcohol swab and let it air dry, do not touch the injection site again before the injection  7. Pull the 2 safety caps straight off - gray/white to uncover the needle cover and the plum cap to uncover the plum activator button, do not remove until immediately prior to injection and do not touch the white needle cover  8. Gently squeeze the area of cleaned skin and hold it firmly to create a firm surface at the selected injection site  9. Put the white needle cover against your skin at the injection site at a 90 degree angle, hold the pen such that you can see the clear medication window  10. Press down and hold the pen firmly against your skin, press the plum activator button to initiate the injection, there will be a click when the injection starts  11. Continue to hold the pen firmly against your skin for about 10-15 seconds - the window will start to turn solid yellow  12. To verify the injection is complete after 10-15 seconds, look and ensure the window is solid yellow and then pull the pen away from your skin  13. Dispose of the used auto-injector pen immediately in your sharps disposal container the needle will be covered automatically  14. If you see  any blood at the injection site, press a cotton ball or gauze on the site and maintain pressure until the bleeding stops, do not rub the injection site    Adherence/Missed dose instructions:  If your injection is given more than 3 days after your scheduled injection date ??? consult your pharmacist for additional instructions on how to adjust your dosing schedule.    Goals of Therapy     - Relief of symptoms  - Maintenance of function  - Prevention of complications of spinal disease  - Minimization of extraspinal and extraarticular manifestations and comorbidities  - Maintenance of effective psychosocial functioning    Side Effects & Monitoring Parameters     ??? Injection site reaction (redness, irritation, inflammation localized to the site of administration)  ??? Signs of a common cold ??? minor sore throat, runny or stuffy nose, etc.  ??? Upset stomach  ??? Headache    The following side effects should be reported to the provider:  ??? Signs of a hypersensitivity reaction ??? rash; hives; itching; red, swollen, blistered, or peeling skin; wheezing; tightness in the chest or throat; difficulty breathing, swallowing, or talking; swelling of the mouth, face, lips, tongue, or throat; etc.  ??? Reduced immune function ??? report signs of infection such as fever; chills; body aches; very bad sore throat; ear or sinus pain; cough; more sputum or change in color of sputum; pain with passing urine; wound that will not heal, etc.  Also at a slightly higher risk of some malignancies (mainly skin and blood cancers) due to this reduced immune function.  o In the case of signs of infection ??? the patient should hold the next dose of Humira?? and call your primary care provider to ensure adequate medical care.  Treatment may be resumed when infection is treated and patient is asymptomatic.  ??? Changes in skin ??? a new growth or lump that forms; changes in shape, size, or color of a previous mole or marking  ??? Signs of unexplained bruising or bleeding ??? throwing up blood or emesis that looks like coffee grounds; black, tarry, or bloody stool; etc.  ??? Signs of new or worsening heart failure ??? shortness of breath; sudden weight gain; heartbeat that is not normal; swelling in the arms or legs that is new or worse      Contraindications, Warnings, & Precautions     ??? Have your bloodwork checked as you have been told by your prescriber  ??? Talk with your doctor if you are pregnant, planning to become pregnant, or breastfeeding  ??? Discuss the possible need for holding your dose(s) of Humira?? when a planned procedure is scheduled with the prescriber as it may delay healing/recovery timeline       Drug/Food Interactions     ??? Medication list reviewed in Epic. The patient was instructed to inform the care team before taking any new medications or supplements. No drug interactions identified.   ??? Talk with you prescriber or pharmacist before receiving any live vaccinations while taking this medication and after you stop taking it    Storage, Handling Precautions, & Disposal     ??? Store this medication in the refrigerator.  Do not freeze  ??? If needed, you may store at room temperature for up to 14 days  ??? Store in original packaging, protected from light  ??? Do not shake  ??? Dispose of used syringes/pens in a Teacher, music  Current Medications (including OTC/herbals), Comorbidities and Allergies     Current Outpatient Medications   Medication Sig Dispense Refill   ??? acetaminophen (TYLENOL) 500 MG tablet Take 500 mg by mouth every six (6) hours as needed for pain. (Patient not taking: Reported on 05/08/2020)     ??? empty container Misc Use as directed (Patient not taking: Reported on 05/08/2020) 1 each 2   ??? HUMIRA PEN CITRATE FREE 40 MG/0.4 ML Inject the contents of 1 pen (40mg ) under the skin every 14 days (Patient not taking: Reported on 05/08/2020) 4 each 6   ??? meloxicam (MOBIC) 15 MG tablet Take 1 tablet (15 mg total) by mouth daily. 30 tablet 2     No current facility-administered medications for this visit.       No Known Allergies    There is no problem list on file for this patient.      Reviewed and up to date in Epic.    Appropriateness of Therapy     Is medication and dose appropriate based on diagnosis? Yes    Prescription has been clinically reviewed: Yes    Baseline Quality of Life Assessment      Rheumatology:   Quality of Life    On a scale of 1 ??? 10 with 1 representing not at all and 10 representing completely ??? how has your rheumatologic condition affected your:  Daily pain level?: decline to answer  Ability to complete your regular daily tasks (prepare meals, get dressed, etc.)?: decline to answer  Ability to participate in social or family activities?: decline to answer         Financial Information     Medication Assistance provided: Prior Authorization    Anticipated copay of $3.00 reviewed with patient. Verified delivery address.    Delivery Information     Scheduled delivery date: 09/29/2019    Expected start date: 09/29/2019    Medication will be delivered via UPS to the prescription address in [facility].  This shipment will not require a signature.      Explained the services we provide at [facility] and that each month we would call to set up refills.  Stressed importance of returning phone calls so that we could ensure they receive their medications in time each month.  Informed patient that we should be setting up refills 7-10 days prior to when they will run out of medication.  A pharmacist will reach out to perform a clinical assessment periodically.  Informed patient that a welcome packet and a drug information handout will be sent.      Patient verbalized understanding of the above information as well as how to contact the pharmacy at [phone] option 4 with any questions/concerns.  The pharmacy is open Monday through Friday 8:30am-4:30pm.  A pharmacist is available 24/7 via pager to answer any clinical questions they may have.    Patient Specific Needs     - Does the patient have any physical, cognitive, or cultural barriers? No    - Patient prefers to have medications discussed with  Patient     - Is the patient or caregiver able to read and understand education materials at a high school level or above? Yes    - Patient's primary language is  English     - Is the patient high risk? No    - Does the patient require a Care Management Plan? No     - Does the patient require  physician intervention or other additional services (i.e. nutrition, smoking cessation, social work)? No      Karene Fry Annibelle Brazie  [facility] Specialty Pharmacist

## 2020-09-27 DIAGNOSIS — M459 Ankylosing spondylitis of unspecified sites in spine: Principal | ICD-10-CM

## 2020-09-28 NOTE — Unmapped (Signed)
Nance Pew 's Humira shipment will be delayed as a result of prior authorization being required by the patient's insurance.     I have reached out to the patient and was unable to leave a message. We will call the patient back to reschedule the delivery upon resolution. We have not confirmed the new delivery date.

## 2020-10-01 NOTE — Unmapped (Signed)
Rheumatology Telemedicine  Visit    Assessment/Plan:    45 y.o. male smoker with prior history of chronic joint pain presenting for follow-up for newly diagnosed ankylosing spondylitis.     Last seen 04/2020 by Dr. Ilsa Iha    Reports some benefit from meloxicam but still reporting AM stiffness lasting at least 2.5 hours. Reports recent injury to eye (reports getting metal in eye) which has now resolved.  Evaluated by ophthalmology locally with unclear details but possible mention of inflammation related to his ankylosing spondylitis. Stressed again to pt to have these records faxed to [place], as this would warrant starting TNF-inhibitor sooner.     - Still having significant stiffness on meloxicam, will try second NSAID- indomethacin. Will escalate to TNF-inhibitor after this.   -  Indomethacin ER 75mg  PO daily. Counseled GI protection.    - Urged pt get COVID vaccine but pt declined.     F/U as scheduled in April with Dr. Ilsa Iha        The patient reports they are currently: not at home. I spent 17 minutes on the phone visit with the patient on the date of service. I spent an additional 5 minutes on pre- and post-visit activities on the date of service.     The patient was not located and I was located within 250 yards of a hospital based location during the phone visit. The patient was physically located in [place] or a state in which I am permitted to provide care. The patient and/or parent/guardian understood that s/he may incur co-pays and cost sharing, and agreed to the telemedicine visit. The visit was reasonable and appropriate under the circumstances given the patient's presentation at the time.    The patient and/or parent/guardian has been advised of the potential risks and limitations of this mode of treatment (including, but not limited to, the absence of in-person examination) and has agreed to be treated using telemedicine. The patient's/patient's family's questions regarding telemedicine have been answered.    If the visit was completed in an ambulatory setting, the patient and/or parent/guardian has also been advised to contact their provider???s office for worsening conditions, and seek emergency medical treatment and/or call 911 if the patient deems either necessary.      ________________________________________________________________      Reason for Visit:  F/u neck and back pain, concern for left ear    Identification: Pt self identified using name and date of birth  Patient location: [place]  The limitations of this telemedicine encounter were discussed with patient. Both the patient and myself agreed to this encounter despite these limitations. Benefits of this telemedicine encounter included allowing for continued care of patient and minimizing risk of exposure to COVID-19.     HPI:    [name] is a 45 y.o. man with prior history of chronic joint pain presenting for follow-up for newly diagnosed ankylosing spondylitis.     Last seen 04/2020 by Dr. Ilsa Iha    At last visit, recommended to start on Humira 40 mg subcutaneous q2weeks given persistent inflammatory axial pain despite scheduled NSAIDs.     Interim History:  Pt presents for f/u today via phone. Attempted to connect via video but unable.  He reports he is doing ok. He was taking meloxicam daily but ran out a few weeks ago. When he was taking it he felt his pain and stiffness was manageable but still present. He still had AM stiffness of 2.5 hours on meloxicam. Now that he is off  the meloxicam his back pain and stiffness are constant. He feels the stiffness lasts throughout the day.     His eye symptoms have resolved now. He reports this was due to metal getting in his eye. He reports his eye doctor did say he had increased inflammation though considering the incident which he thought could be related to his AS.     He reports his dental infection has cleared but still has multiple teeth that need to be pulled. He is currently on waiting list to have these addressed.     He denies recent fever or chills. He denies recent illness. He hasn't had COVID vaccine and doesn't plan to receive. He hasn't had flu vaccine this season and reports he never gets these.     Rheumatologic History:    Established care in 10/2019  at which time patient endorsed several year history of back pain in stiffness with recent worsening of symptoms in upper thoracic and cervical spine.  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 Noted to have severely limited Schober's and Occiput to Wall at that time. Lab work notable for mildly elevated ESR and CRP.  [id number] +.   ID Screen negative Quant gold, HIV, HCV and HBV serologies. Imaging notable for fusion of b/l sacroiliac joints, L1-L4 facet ankylosis. 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  Based on his history and presentation, high suspicion for ankylosing spondylitis and recommended trial of scheduled NSAID (meloxicam) but remains symptomatic.   Recommended patient start on anti-TNF (Humira) in 12/2019 but this was not started due to prolonged dental infection.     ROS: 14 systems were reviewed and are negative except for that mentioned in the HPI.     Allergies: Patient has no known allergies.    Past Medical History:   Diagnosis Date   ??? Arthritis     Waiting on test results from Rheumotologist appointment 2/17   ??? 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 Headache 2019-2020    Starting getting them after my neck got really bad.       Current Outpatient Medications on File Prior to Visit   Medication Sig Dispense Refill   ??? acetaminophen (TYLENOL) 500 MG tablet Take 500 mg by mouth every six (6) hours as needed for pain. (Patient not taking: Reported on 05/08/2020)     ??? empty container Misc Use as directed (Patient not taking: Reported on 05/08/2020) 1 each 2   ??? HUMIRA PEN CITRATE FREE 40 MG/0.4 ML Inject the contents of 1 pen (40mg ) under the skin every 14 days (Patient not taking: Reported on 05/08/2020) 4 each 6   ??? meloxicam (MOBIC) 15 MG tablet Take 1 tablet (15 mg total) by mouth daily. 30 tablet 2     No current facility-administered medications on file prior to visit.       PSHx: No change from prior visit     Family History   Problem Relation Age of Onset   ??? Cancer Maternal Grandfather         Brought on by diabetes   ??? Diabetes Maternal Grandfather         State 2 diabetes insulin dependent   ??? Cancer Mother         Lung and breast cancer   ??? Cancer Maternal Grandmother         Leukemia     Social History     Tobacco  Use   ??? Smoking status: Current Every Day Smoker     Packs/day: 1.00     Years: 34.00     Pack years: 34.00     Types: Cigarettes     Start date: 06/23/1987   ??? Smokeless tobacco: Never Used   Substance Use Topics   ??? Alcohol use: Yes     Comment: 3-4 times a year   ??? Drug use: Never       Physical Exam:  There were no vitals filed for this visit.  General:   Does not sound to be in distress   Lungs:  No wheezing, coughing, or increased respiratory effort noted   Psych:  Appropriate interaction

## 2020-10-02 ENCOUNTER — Telehealth: Admit: 2020-10-02 | Discharge: 2020-10-03 | Payer: PRIVATE HEALTH INSURANCE | Attending: Family | Primary: Family

## 2020-10-02 DIAGNOSIS — M459 Ankylosing spondylitis of unspecified sites in spine: Principal | ICD-10-CM

## 2020-10-02 MED ORDER — INDOMETHACIN ER 75 MG CAPSULE,EXTENDED RELEASE
ORAL_CAPSULE | Freq: Every day | ORAL | 1 refills | 90.00000 days | Status: CP
Start: 2020-10-02 — End: 2020-10-29

## 2020-10-03 DIAGNOSIS — M459 Ankylosing spondylitis of unspecified sites in spine: Principal | ICD-10-CM

## 2020-10-03 NOTE — Unmapped (Signed)
Jason Wagner 's Humira shipment will be canceled  as a result of prior authorization being required by the patient's insurance.     I have reached out to the patient in addition to the clinic and communicated the delivery change. We will not reschedule the medication and have removed this/these medication(s) from the work request.  We have canceled this work request.

## 2020-10-16 NOTE — Unmapped (Signed)
This patient has been disenrolled from the [facility] specialty pharmacy services due to Humira being denied by insurance - a trial of another NSAID is being pursued currently.  The patient can be re-enrolled in specialty pharmacy services upon receipt of a new prescription order for a specialty medication.    Jason Wagner  [facility] [employer] Specialty Pharmacist

## 2020-10-29 DIAGNOSIS — M459 Ankylosing spondylitis of unspecified sites in spine: Principal | ICD-10-CM

## 2020-10-29 MED ORDER — INDOMETHACIN ER 75 MG CAPSULE,EXTENDED RELEASE
ORAL_CAPSULE | Freq: Every day | ORAL | 1 refills | 90 days | Status: CP
Start: 2020-10-29 — End: 2021-01-27

## 2020-11-15 MED ORDER — HUMIRA PEN CITRATE FREE 40 MG/0.4 ML
SUBCUTANEOUS | 2 refills | 84 days | Status: CP
Start: 2020-11-15 — End: ?

## 2021-01-03 NOTE — Unmapped (Signed)
Onboarding was attempted but NOT completed - awaiting patient to return our outreach call.  No DDIs noted and labs clear to start.  Patient has been onboarded before - last shipment sent April 2021 but patient never took med. New insurance required other therpay, which he as since failed and now pa approved to start humira. May need sharps kit.

## 2021-01-08 ENCOUNTER — Ambulatory Visit: Admit: 2021-01-08 | Payer: PRIVATE HEALTH INSURANCE

## 2021-01-14 NOTE — Unmapped (Signed)
The [facility] has made a third and final attempt to reach this patient to fill the following medication:Humira.      We have been unable to leave messages on the following phone numbers: [phone] and have sent a MyChart message.    Dates contacted: 4/14, 4/18, & 4/25  Last scheduled delivery: n/a - never filled    The patient may be at risk of non-compliance with this medication. The patient should call the [facility] at [phone] (option 4) to refill medication.    Clydell Hakim   [facility] [employer] [facility] Specialty Pharmacist

## 2021-03-22 NOTE — Unmapped (Signed)
Pt left VM stating he would like a call back from Lindsey about his blood work, next appt, and med refills.     Returned call to pt, no answer, unable to leave VM.

## 2021-04-02 NOTE — Unmapped (Signed)
Specialty Medication(s): Humira 40mg /0.54mL    Jason Wagner has been dis-enrolled from the [facility] specialty pharmacy services due to multiple unsuccessful outreach attempts by the pharmacy.    Additional information provided to the patient: n/a    [name]  [facility] Specialty Pharmacist

## 2021-04-29 NOTE — Unmapped (Signed)
[facility] Rheumatology Telemedicine Visit    The patient reports they are currently: at home. I spent 24 minutes on the real-time audio and video visit with the patient on the date of service. I spent an additional 5 minutes on pre- and post-visit activities on the date of service.     The patient was not located and I was located within 250 yards of a hospital based location during the real-time audio and video visit. The patient was physically located in [place] or a state in which I am permitted to provide care. The patient and/or parent/guardian understood that s/he may incur co-pays and cost sharing, and agreed to the telemedicine visit. The visit was reasonable and appropriate under the circumstances given the patient's presentation at the time.    The patient and/or parent/guardian has been advised of the potential risks and limitations of this mode of treatment (including, but not limited to, the absence of in-person examination) and has agreed to be treated using telemedicine. The patient's/patient's family's questions regarding telemedicine have been answered.    If the visit was completed in an ambulatory setting, the patient and/or parent/guardian has also been advised to contact their provider???s office for worsening conditions, and seek emergency medical treatment and/or call 911 if the patient deems either necessary.          REASON FOR VISIT: F/U AS    Identification: Pt self identified using name and date of birth  Patient location: [place]  The limitations of this telemedicine encounter were discussed with patient. Both the patient and myself agreed to this encounter despite these limitations. Benefits of this telemedicine encounter included allowing for continued care of patient and minimizing risk of exposure to COVID-19.     HISTORY: Jason Wagner is a 45 y.o. male with prior history of chronic joint pain presenting for follow-up for newly diagnosed ankylosing spondylitis.   ??  Last seen 10/02/20 via telemedicine with myself.     No Show: 09/12/20, 01/08/21  ??  At last visit, started indomethacin due to no benefit from meloxicam. In February 2022 pt reported limited benefit from indomethacin so ordered Jason which was approved but pharmacy was unable to contact pt to set up delivery after multiple attempts.     Interim history:   Pt presents via video call for follow up. He reports his back and neck stiffness have worsened since last visit and he is now interested in starting Jason as previously planned. He reports stiffness which lasts throughout the day. He also report shoulder pain, L>R which has started in the last month. He reports previous injury to this shoulder but not recently. He notices this pain at rest and with use. He denies recent joint swelling. He is taking indomethacin which helps pain and stiffness some but still limited due to symptoms.     He denies recent eye symptoms. He is now having to wear glasses. He denies recent fever, illness, or infections. He hasn't had COVID or vaccine.     CURRENT MEDICATIONS:  Current Outpatient Medications   Medication Sig Dispense Refill   ??? acetaminophen (TYLENOL) 500 MG tablet Take 500 mg by mouth every six (6) hours as needed for pain. (Patient not taking: Reported on 05/08/2020)     ??? empty container Misc Use as directed (Patient not taking: Reported on 05/08/2020) 1 each 2   ??? Jason Wagner CITRATE FREE 40 MG/0.4 ML Inject the contents of 1 Wagner (40 mg total) under the skin every fourteen (  14) days. 6 each 2   ??? meloxicam (MOBIC) 15 MG tablet Take 1 tablet (15 mg total) by mouth daily. 30 tablet 2     No current facility-administered medications for this visit.       Past Medical History:   Diagnosis Date   ??? Arthritis     Waiting on test results from Rheumotologist appointment 2/17   ??? 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 Headache 2019-2020    Starting getting them after my neck got really bad.        Record Review: Available records were reviewed, including pertinent office visits, labs, and imaging.      REVIEW OF SYSTEMS: Ten system were reviewed and negative except as noted above.    PHYSICAL EXAM:  Patient reported vitals:  There were no vitals filed for this visit.   General:   Does not sound to be in distress   Lungs:  No wheezing, coughing, or increased respiratory effort noted   Psych:  Appropriate interaction       ASSESSMENT/PLAN:    45 y.o. male smoker with prior history of chronic joint pain presenting for follow-up for newly diagnosed ankylosing spondylitis.   ??  Last seen 10/02/20 via telemedicine with myself.     Reporting worsening back and neck stiffness today and would like to start Jason as previously planned. No recent eye symptoms.     Ankylosing Spondylitis: not controlled.   - Continue Indomethacin 75mg  ER PO PRN daily. Advised GI protection  - Update labs for medication monitoring: CBCw/diff, Crt, AST, ALT. Pt agrees to complete in next week [facility] two [facility].   - Start Jason 40mg  [name] every other week. Will contact pharmacy to get shipment of this set up. Stressed to pt need to answer phone, return calls, and check voicemail.   - Discussed the risks of use to include injection site reaction, allergic reaction like anaphylaxis, and serious infection. Also discussed the risk of malignancy and demyelinating disease. Pt is to seek medical attention for any signs/symptoms of infection so that this may be properly treated. Pt verbalizes understanding and wishes to proceed with therapy.   - Advised COVID vaccine but pt declines.       F/U in 4-6 months with Dr. Ilsa Iha

## 2021-04-30 ENCOUNTER — Telehealth: Admit: 2021-04-30 | Discharge: 2021-05-01 | Payer: PRIVATE HEALTH INSURANCE | Attending: Family | Primary: Family

## 2021-04-30 DIAGNOSIS — M459 Ankylosing spondylitis of unspecified sites in spine: Principal | ICD-10-CM

## 2021-04-30 DIAGNOSIS — Z791 Long term (current) use of non-steroidal anti-inflammatories (NSAID): Principal | ICD-10-CM

## 2021-04-30 MED ORDER — INDOMETHACIN ER 75 MG CAPSULE,EXTENDED RELEASE
ORAL_CAPSULE | Freq: Every day | ORAL | 0 refills | 30.00000 days | Status: CP
Start: 2021-04-30 — End: 2021-05-30

## 2021-04-30 MED ORDER — EMPTY CONTAINER
2 refills | 0 days
Start: 2021-04-30 — End: ?

## 2021-04-30 NOTE — Unmapped (Signed)
[facility]   Patient Onboarding/Medication Counseling    Jason Wagner is a 45 y.o. male with Ankylosing spondylitis who I am counseling today on initiation of therapy.  I am speaking to the patient.     Was a Nurse, learning disability used for this call? No    Verified patient's date of birth / HIPAA.    Specialty medication(s) to be sent: Inflammatory Disorders: Humira      Non-specialty medications/supplies to be sent: Sharps container kit      Medications not needed at this time: n/a         Humira (adalimumab)    The patient declined counseling on medication administration, missed dose instructions, goals of therapy, side effects and monitoring parameters, warnings and precautions, drug/food interactions and storage, handling precautions, and disposal because they were counseled in clinic and have been onboarded for this medication previously. The information in the declined sections below are for informational purposes only and was not discussed with patient.       Medication & Administration     Dosage: Ankylosing spondylitis: Inject 40mg  under the skin every 14 days    Lab tests required prior to treatment initiation:  ??? Tuberculosis: Tuberculosis screening resulted in a non-reactive Quantiferon TB Gold assay.  ??? Hepatitis B: Hepatitis B serology studies are complete and non-reactive.    Administration:     Prefilled auto-injector pen  1. Gather all supplies needed for injection on a clean, flat working surface: medication pen removed from packaging, alcohol swab, sharps container, etc.  2. Look at the medication label - look for correct medication, correct dose, and check the expiration date  3. Look at the medication - the liquid visible in the window on the side of the pen device should appear clear and colorless  4. Lay the auto-injector pen on a flat surface and allow it to warm up to room temperature for at least 30-45 minutes  5. Select injection site - you can use the front of your thigh or your belly (but not the area 2 inches around your belly button); if someone else is giving you the injection you can also use your upper arm in the skin covering your triceps muscle  6. Prepare injection site - wash your hands and clean the skin at the injection site with an alcohol swab and let it air dry, do not touch the injection site again before the injection  7. Pull the 2 safety caps straight off - gray/white to uncover the needle cover and the plum cap to uncover the plum activator button, do not remove until immediately prior to injection and do not touch the white needle cover  8. Gently squeeze the area of cleaned skin and hold it firmly to create a firm surface at the selected injection site  9. Put the white needle cover against your skin at the injection site at a 90 degree angle, hold the pen such that you can see the clear medication window  10. Press down and hold the pen firmly against your skin, press the plum activator button to initiate the injection, there will be a click when the injection starts  11. Continue to hold the pen firmly against your skin for about 10-15 seconds - the window will start to turn solid yellow  12. To verify the injection is complete after 10-15 seconds, look and ensure the window is solid yellow and then pull the pen away from your skin  13. Dispose of the  used auto-injector pen immediately in your sharps disposal container the needle will be covered automatically  14. If you see any blood at the injection site, press a cotton ball or gauze on the site and maintain pressure until the bleeding stops, do not rub the injection site    Adherence/Missed dose instructions:  If your injection is given more than 3 days after your scheduled injection date - consult your pharmacist for additional instructions on how to adjust your dosing schedule.    Goals of Therapy     - Relief of symptoms  - Maintenance of function  - Prevention of complications of spinal disease  - Minimization of extraspinal and extraarticular manifestations and comorbidities  - Maintenance of effective psychosocial functioning    Side Effects & Monitoring Parameters     ??? Injection site reaction (redness, irritation, inflammation localized to the site of administration)  ??? Signs of a common cold - minor sore throat, runny or stuffy nose, etc.  ??? Upset stomach  ??? Headache    The following side effects should be reported to the provider:  ??? Signs of a hypersensitivity reaction - rash; hives; itching; red, swollen, blistered, or peeling skin; wheezing; tightness in the chest or throat; difficulty breathing, swallowing, or talking; swelling of the mouth, face, lips, tongue, or throat; etc.  ??? Reduced immune function - report signs of infection such as fever; chills; body aches; very bad sore throat; ear or sinus pain; cough; more sputum or change in color of sputum; pain with passing urine; wound that will not heal, etc.  Also at a slightly higher risk of some malignancies (mainly skin and blood cancers) due to this reduced immune function.  o In the case of signs of infection - the patient should hold the next dose of Humira?? and call your primary care provider to ensure adequate medical care.  Treatment may be resumed when infection is treated and patient is asymptomatic.  ??? Changes in skin - a new growth or lump that forms; changes in shape, size, or color of a previous mole or marking  ??? Signs of unexplained bruising or bleeding - throwing up blood or emesis that looks like coffee grounds; black, tarry, or bloody stool; etc.  ??? Signs of new or worsening heart failure - shortness of breath; sudden weight gain; heartbeat that is not normal; swelling in the arms or legs that is new or worse      Contraindications, Warnings, & Precautions     ??? Have your bloodwork checked as you have been told by your prescriber  ??? Talk with your doctor if you are pregnant, planning to become pregnant, or breastfeeding  ??? Discuss the possible need for holding your dose(s) of Humira?? when a planned procedure is scheduled with the prescriber as it may delay healing/recovery timeline       Drug/Food Interactions     ??? Medication list reviewed in Epic. The patient was instructed to inform the care team before taking any new medications or supplements. No drug interactions identified.   ??? Talk with you prescriber or pharmacist before receiving any live vaccinations while taking this medication and after you stop taking it    Storage, Handling Precautions, & Disposal     ??? Store this medication in the refrigerator.  Do not freeze  ??? If needed, you may store at room temperature for up to 14 days  ??? Store in original packaging, protected from light  ??? Do not shake  ???  Dispose of used syringes/pens in a sharps disposal container            Current Medications (including OTC/herbals), Comorbidities and Allergies     Current Outpatient Medications   Medication Sig Dispense Refill   ??? acetaminophen (TYLENOL) 500 MG tablet Take 500 mg by mouth every six (6) hours as needed for pain. (Patient not taking: Reported on 05/08/2020)     ??? empty container Misc Use as directed (Patient not taking: Reported on 05/08/2020) 1 each 2   ??? HUMIRA PEN CITRATE FREE 40 MG/0.4 ML Inject the contents of 1 pen (40 mg total) under the skin every fourteen (14) days. 6 each 2   ??? indomethacin (INDOCIN SR) 75 mg CR capsule Take 1 capsule (75 mg total) by mouth daily. Do not crush or chew. Swallow capsule whole. 30 capsule 0     No current facility-administered medications for this visit.       No Known Allergies    There is no problem list on file for this patient.      Reviewed and up to date in Epic.    Appropriateness of Therapy     Acute infections noted within Epic:  No active infections  Patient reported infection: None    Is medication and dose appropriate based on diagnosis and infection status? Yes    Prescription has been clinically reviewed: Yes      Baseline Quality of Life Assessment      How many days over the past month did your Ankylosing spondylitis  keep you from your normal activities? For example, brushing your teeth or getting up in the morning. Patient declined to answer    Financial Information     Medication Assistance provided: None Required    Anticipated copay of $4 (28 days) reviewed with patient. Verified delivery address.    Delivery Information     Scheduled delivery date: 05/02/21    Expected start date: 05/02/21    Medication will be delivered via UPS to the prescription address in [facility].  This shipment will not require a signature.      Explained the services we provide at [facility] and that each month we would call to set up refills.  Stressed importance of returning phone calls so that we could ensure they receive their medications in time each month.  Informed patient that we should be setting up refills 7-10 days prior to when they will run out of medication.  A pharmacist will reach out to perform a clinical assessment periodically.  Informed patient that a welcome packet, containing information about our pharmacy and other support services, a Notice of Privacy Practices, and a drug information handout will be sent.      The patient or caregiver noted above participated in the development of this care plan and knows that they can request review of or adjustments to the care plan at any time.      Patient or caregiver verbalized understanding of the above information as well as how to contact the pharmacy at [phone] option 4 with any questions/concerns.  The pharmacy is open Monday through Friday 8:30am-4:30pm.  A pharmacist is available 24/7 via pager to answer any clinical questions they may have.    Patient Specific Needs     - Does the patient have any physical, cognitive, or cultural barriers? No    - Does the patient have adequate living arrangements? (i.e. the ability to store and take their  medication appropriately) Yes    - Did you identify any home environmental safety or security hazards? No    - Patient prefers to have medications discussed with  Patient     - Is the patient or caregiver able to read and understand education materials at a high school level or above? Yes    - Patient's primary language is  English     - Is the patient high risk? No    - Does the patient require physician intervention or other additional services (i.e. dietary/nutrition, smoking cessation, social work)? No      [name]  [facility] Specialty Pharmacist

## 2021-05-01 MED FILL — EMPTY CONTAINER: 120 days supply | Qty: 1 | Fill #0

## 2021-05-01 MED FILL — HUMIRA PEN CITRATE FREE 40 MG/0.4 ML: SUBCUTANEOUS | 28 days supply | Qty: 2 | Fill #0

## 2021-05-23 NOTE — Unmapped (Signed)
Jason Wagner, pt would like refill of Humira and a phone call from you.

## 2021-05-23 NOTE — Unmapped (Signed)
Returned pt's phone call. He reports benefit from Humira but needs refill. Provided pt with [facility] number to discuss shipment of refills. Pt agrees to call [facility] shared services today.     [name], FNP

## 2021-05-24 NOTE — Unmapped (Signed)
[facility] Clinical Assessment & Refill Coordination Note    Jaishaun Mcnab, Harris: January 08, 1976  Phone: [phone] (home)     All above HIPAA information was verified with patient.     Was a Nurse, learning disability used for this call? No    Specialty Medication(s):   Inflammatory Disorders: Humira     Current Outpatient Medications   Medication Sig Dispense Refill   ??? acetaminophen (TYLENOL) 500 MG tablet Take 500 mg by mouth every six (6) hours as needed for pain. (Patient not taking: Reported on 05/08/2020)     ??? empty container (SHARPS-A-GATOR DISPOSAL SYSTEM) Misc Use as directed for sharps disposal 1 each 2   ??? empty container Misc Use as directed (Patient not taking: Reported on 05/08/2020) 1 each 2   ??? HUMIRA PEN CITRATE FREE 40 MG/0.4 ML Inject the contents of 1 pen (40 mg total) under the skin every fourteen (14) days. 6 each 2   ??? indomethacin (INDOCIN SR) 75 mg CR capsule Take 1 capsule (75 mg total) by mouth daily. Do not crush or chew. Swallow capsule whole. 30 capsule 0     No current facility-administered medications for this visit.        Changes to medications: Muaz reports no changes at this time.    No Known Allergies    Changes to allergies: No    SPECIALTY MEDICATION ADHERENCE       Medication Adherence    Patient reported X missed doses in the last month: 0  Specialty Medication: Humira [phone]  Patient is on additional specialty medications: No  Informant: patient          Specialty medication(s) dose(s) confirmed: Regimen is correct and unchanged.     Are there any concerns with adherence? No    Adherence counseling provided? Not needed    CLINICAL MANAGEMENT AND INTERVENTION      Clinical Benefit Assessment:    Do you feel the medicine is effective or helping your condition? No    Clinical Benefit counseling provided? Reasonable expectations discussed: it can take up to 12 weeks to see full benefits, we will reassess in a few months    Adverse Effects Assessment:    Are you experiencing any side effects? No    Are you experiencing difficulty administering your medicine? No    Quality of Life Assessment:    Quality of Life    Rheumatology  1. What impact has your specialty medication had on the reduction of your daily pain level?: None  2. What impact has your specialty medication had on your ability to complete daily tasks (prepare meals, get dressed, etc...)?: None  Oncology  Dermatology  Cystic Fibrosis              Have you discussed this with your provider? Not needed    Acute Infection Status:    Acute infections noted within Epic:  No active infections  Patient reported infection: None    Therapy Appropriateness:    Is therapy appropriate? Yes, therapy is appropriate and should be continued    DISEASE/MEDICATION-SPECIFIC INFORMATION      For patients on injectable medications: Patient currently has 0 doses left.  Next injection is scheduled for 9/9.    PATIENT SPECIFIC NEEDS     - Does the patient have any physical, cognitive, or cultural barriers? No    - Is the patient high risk? No    - Does the patient require a Care Management  Plan? No     - Does the patient require physician intervention or other additional services (i.e. nutrition, smoking cessation, social work)? No      SHIPPING     Specialty Medication(s) to be Shipped:   Inflammatory Disorders: Humira    Other medication(s) to be shipped: No additional medications requested for fill at this time     Changes to insurance: No    Delivery Scheduled: Yes, Expected medication delivery date: 9/8.     Medication will be delivered via UPS to the confirmed prescription address in [facility].    The patient will receive a drug information handout for each medication shipped and additional FDA Medication Guides as required.  Verified that patient has previously received a Conservation officer, historic buildings and a Surveyor, mining.    The patient or caregiver noted above participated in the development of this care plan and knows that they can request review of or adjustments to the care plan at any time.      All of the patient's questions and concerns have been addressed.    Julianne Rice   [facility] Specialty Pharmacist

## 2021-05-27 DIAGNOSIS — Z791 Long term (current) use of non-steroidal anti-inflammatories (NSAID): Principal | ICD-10-CM

## 2021-05-27 DIAGNOSIS — M459 Ankylosing spondylitis of unspecified sites in spine: Principal | ICD-10-CM

## 2021-05-27 MED ORDER — INDOMETHACIN ER 75 MG CAPSULE,EXTENDED RELEASE
ORAL_CAPSULE | 0 refills | 0 days
Start: 2021-05-27 — End: ?

## 2021-05-28 MED ORDER — INDOMETHACIN ER 75 MG CAPSULE,EXTENDED RELEASE
ORAL_CAPSULE | ORAL | 0 refills | 0.00000 days | Status: CP
Start: 2021-05-28 — End: ?

## 2021-05-28 NOTE — Unmapped (Signed)
Indomethacin refill  Last ov: Visit date not found  Next ov: 08/29/2021

## 2021-05-29 MED FILL — HUMIRA PEN CITRATE FREE 40 MG/0.4 ML: SUBCUTANEOUS | 28 days supply | Qty: 2 | Fill #1

## 2021-06-25 NOTE — Unmapped (Signed)
[facility] Refill Coordination Note    Specialty Medication(s) to be Shipped:   Inflammatory Disorders: Humira    Other medication(s) to be shipped: No additional medications requested for fill at this time     Jason Wagner, DOB: 01-May-1976  Phone: [phone] (home)       All above HIPAA information was verified with patient.     Was a Nurse, learning disability used for this call? No    Completed refill call assessment today to schedule patient's medication shipment from the [facility] [phone]).  All relevant notes have been reviewed.     Specialty medication(s) and dose(s) confirmed: Regimen is correct and unchanged.   Changes to medications: Jason Wagner reports no changes at this time.  Changes to insurance: No  New side effects reported not previously addressed with a pharmacist or physician: None reported  Questions for the pharmacist: No    Confirmed patient received a Conservation officer, historic buildings and a Surveyor, mining with first shipment. The patient will receive a drug information handout for each medication shipped and additional FDA Medication Guides as required.       DISEASE/MEDICATION-SPECIFIC INFORMATION        For patients on injectable medications: Patient currently has 0 doses left.  Next injection is scheduled for 10/6.    SPECIALTY MEDICATION ADHERENCE     Medication Adherence    Patient reported X missed doses in the last month: 0  Specialty Medication: Humira  Patient is on additional specialty medications: No  Patient is on more than two specialty medications: No  Any gaps in refill history greater than 2 weeks in the last 3 months: no  Demonstrates understanding of importance of adherence: yes  Informant: patient              Were doses missed due to medication being on hold? No    Humira 40mg /0.54ml: Patient has 0 days of medication on hand    REFERRAL TO PHARMACIST     Referral to the pharmacist: Not needed      [name]     Shipping address confirmed in Epic. Delivery Scheduled: Yes, Expected medication delivery date: 10/6.     Medication will be delivered via UPS to the prescription address in Epic WAM.    Jason Wagner   [name]

## 2021-06-26 MED FILL — HUMIRA PEN CITRATE FREE 40 MG/0.4 ML: SUBCUTANEOUS | 28 days supply | Qty: 2 | Fill #2

## 2021-06-28 DIAGNOSIS — Z791 Long term (current) use of non-steroidal anti-inflammatories (NSAID): Principal | ICD-10-CM

## 2021-06-28 DIAGNOSIS — M459 Ankylosing spondylitis of unspecified sites in spine: Principal | ICD-10-CM

## 2021-06-28 MED ORDER — INDOMETHACIN ER 75 MG CAPSULE,EXTENDED RELEASE
ORAL_CAPSULE | 0 refills | 0 days | Status: CP
Start: 2021-06-28 — End: ?

## 2021-06-28 NOTE — Unmapped (Signed)
Indomethacin refill  Last ov: Visit date not found  Next ov: 08/29/2021

## 2021-07-15 NOTE — Unmapped (Signed)
[facility] Refill Coordination Note    Specialty Medication(s) to be Shipped:   Inflammatory Disorders: Humira    Other medication(s) to be shipped: No additional medications requested for fill at this time     Jason Wagner, DOB: May 25, 1976  Phone: [phone] (home)       All above HIPAA information was verified with patient.     Was a Nurse, learning disability used for this call? No    Completed refill call assessment today to schedule patient's medication shipment from the [facility] [phone]).  All relevant notes have been reviewed.     Specialty medication(s) and dose(s) confirmed: Regimen is correct and unchanged.   Changes to medications: [name] reports no changes at this time.  Changes to insurance: No  New side effects reported not previously addressed with a pharmacist or physician: None reported  Questions for the pharmacist: No    Confirmed patient received a Conservation officer, historic buildings and a Surveyor, mining with first shipment. The patient will receive a drug information handout for each medication shipped and additional FDA Medication Guides as required.       DISEASE/MEDICATION-SPECIFIC INFORMATION        For patients on injectable medications: Patient currently has 1 doses left.  Next injection is scheduled for 07/15/2021.    SPECIALTY MEDICATION ADHERENCE     Medication Adherence    Patient reported X missed doses in the last month: 0  Specialty Medication: Humira  Patient is on additional specialty medications: No        Were doses missed due to medication being on hold? No    REFERRAL TO PHARMACIST     Referral to the pharmacist: Not needed      [facility]     Shipping address confirmed in Epic.     Delivery Scheduled: Yes, Expected medication delivery date: 07/26/2021.     Medication will be delivered via UPS to the prescription address in Epic WAM.    [name] [facility] Specialty Technician

## 2021-07-25 MED FILL — HUMIRA PEN CITRATE FREE 40 MG/0.4 ML: SUBCUTANEOUS | 28 days supply | Qty: 2 | Fill #3

## 2021-08-13 NOTE — Unmapped (Signed)
[facility] Refill Coordination Note    Specialty Medication(s) to be Shipped:   Inflammatory Disorders: Humira    Other medication(s) to be shipped: No additional medications requested for fill at this time     Jason Wagner, DOB: 08/10/1976  Phone: [phone] (home)       All above HIPAA information was verified with patient.     Was a Nurse, learning disability used for this call? No    Completed refill call assessment today to schedule patient's medication shipment from the [facility] [phone]).  All relevant notes have been reviewed.     Specialty medication(s) and dose(s) confirmed: Regimen is correct and unchanged.   Changes to medications: [name] reports no changes at this time.  Changes to insurance: No  New side effects reported not previously addressed with a pharmacist or physician: None reported  Questions for the pharmacist: No    Confirmed patient received a Conservation officer, historic buildings and a Surveyor, mining with first shipment. The patient will receive a drug information handout for each medication shipped and additional FDA Medication Guides as required.       DISEASE/MEDICATION-SPECIFIC INFORMATION        For patients on injectable medications: Patient currently has 0 doses left.  Next injection is scheduled for 08/26/21.    SPECIALTY MEDICATION ADHERENCE     Medication Adherence    Patient reported X missed doses in the last month: 0  Specialty Medication: Humira  Patient is on additional specialty medications: No              Were doses missed due to medication being on hold? No    Humira  40/0.4 mg/ml: 0 days of medicine on hand        REFERRAL TO PHARMACIST     Referral to the pharmacist: Not needed      [facility]     Shipping address confirmed in Epic.     Delivery Scheduled: Yes, Expected medication delivery date: 08/21/21.     Medication will be delivered via UPS to the prescription address in Epic WAM.    Unk Lightning   [facility] Specialty Technician

## 2021-08-20 MED FILL — HUMIRA PEN CITRATE FREE 40 MG/0.4 ML: SUBCUTANEOUS | 28 days supply | Qty: 2 | Fill #4

## 2021-08-22 DIAGNOSIS — Z791 Long term (current) use of non-steroidal anti-inflammatories (NSAID): Principal | ICD-10-CM

## 2021-08-22 DIAGNOSIS — M459 Ankylosing spondylitis of unspecified sites in spine: Principal | ICD-10-CM

## 2021-08-22 MED ORDER — INDOMETHACIN ER 75 MG CAPSULE,EXTENDED RELEASE
ORAL_CAPSULE | 0 refills | 0.00000 days
Start: 2021-08-22 — End: ?

## 2021-08-23 MED ORDER — INDOMETHACIN ER 75 MG CAPSULE,EXTENDED RELEASE
ORAL_CAPSULE | Freq: Every day | ORAL | 0 refills | 15.00000 days | Status: CP
Start: 2021-08-23 — End: ?

## 2021-08-23 NOTE — Unmapped (Signed)
Indomethacin 75 mg ER capsules     Last Visit Date: Visit date not found  Next Visit Date: 08/29/2021    Lab Results   Component Value Date    ALT 12 05/08/2020    AST 14 05/08/2020    ALBUMIN 3.9 05/08/2020    CREATININE 0.63 05/08/2020     Lab Results   Component Value Date    WBC 7.5 05/08/2020    HGB 13.0 (L) 05/08/2020    HCT 37.4 (L) 05/08/2020    PLT 229 05/08/2020     Lab Results   Component Value Date    NEUTROPCT 64.7 05/08/2020    LYMPHOPCT 24.1 05/08/2020    MONOPCT 8.6 05/08/2020    EOSPCT 1.8 05/08/2020    BASOPCT 0.8 05/08/2020

## 2021-08-29 ENCOUNTER — Ambulatory Visit: Admit: 2021-08-29 | Discharge: 2021-08-29 | Payer: PRIVATE HEALTH INSURANCE

## 2021-08-29 DIAGNOSIS — Z791 Long term (current) use of non-steroidal anti-inflammatories (NSAID): Principal | ICD-10-CM

## 2021-08-29 DIAGNOSIS — M459 Ankylosing spondylitis of unspecified sites in spine: Principal | ICD-10-CM

## 2021-08-29 DIAGNOSIS — Z79899 Other long term (current) drug therapy: Principal | ICD-10-CM

## 2021-08-29 LAB — COMPREHENSIVE METABOLIC PANEL
ALBUMIN: 4.7 g/dL (ref 3.4–5.0)
ALKALINE PHOSPHATASE: 78 U/L (ref 46–116)
ALT (SGPT): 16 U/L (ref 10–49)
ANION GAP: 8 mmol/L (ref 5–14)
AST (SGOT): 16 U/L (ref <=34)
BILIRUBIN TOTAL: 0.6 mg/dL (ref 0.3–1.2)
BLOOD UREA NITROGEN: 15 mg/dL (ref 9–23)
BUN / CREAT RATIO: 20
CALCIUM: 9.6 mg/dL (ref 8.7–10.4)
CHLORIDE: 105 mmol/L (ref 98–107)
CO2: 27.7 mmol/L (ref 20.0–31.0)
CREATININE: 0.76 mg/dL
EGFR CKD-EPI (2021) MALE: >90 mL/min/{1.73_m2} (ref >=60)
GLUCOSE RANDOM: 85 mg/dL (ref 70–179)
POTASSIUM: 4.1 mmol/L (ref 3.4–4.8)
PROTEIN TOTAL: 7.2 g/dL (ref 5.7–8.2)
SODIUM: 141 mmol/L (ref 135–145)

## 2021-08-29 LAB — CBC W/ AUTO DIFF
BASOPHILS ABSOLUTE COUNT: 0.1 10*9/L (ref 0.0–0.1)
BASOPHILS RELATIVE PERCENT: 0.7 %
EOSINOPHILS ABSOLUTE COUNT: 0.1 10*9/L (ref 0.0–0.5)
EOSINOPHILS RELATIVE PERCENT: 1.3 %
HEMATOCRIT: 43.1 % (ref 39.0–48.0)
HEMOGLOBIN: 14.9 g/dL (ref 12.9–16.5)
LYMPHOCYTES ABSOLUTE COUNT: 2.4 10*9/L (ref 1.1–3.6)
LYMPHOCYTES RELATIVE PERCENT: 31.2 %
MEAN CORPUSCULAR HEMOGLOBIN CONC: 34.5 g/dL (ref 32.0–36.0)
MEAN CORPUSCULAR HEMOGLOBIN: 31.6 pg (ref 25.9–32.4)
MEAN CORPUSCULAR VOLUME: 91.7 fL (ref 77.6–95.7)
MEAN PLATELET VOLUME: 6.8 fL (ref 6.8–10.7)
MONOCYTES ABSOLUTE COUNT: 0.7 10*9/L (ref 0.3–0.8)
MONOCYTES RELATIVE PERCENT: 9.2 %
NEUTROPHILS ABSOLUTE COUNT: 4.5 10*9/L (ref 1.8–7.8)
NEUTROPHILS RELATIVE PERCENT: 57.6 %
PLATELET COUNT: 180 10*9/L (ref 150–450)
RED BLOOD CELL COUNT: 4.7 10*12/L (ref 4.26–5.60)
RED CELL DISTRIBUTION WIDTH: 13.8 % (ref 12.2–15.2)
WBC ADJUSTED: 7.8 10*9/L (ref 3.6–11.2)

## 2021-08-29 LAB — SEDIMENTATION RATE: ERYTHROCYTE SEDIMENTATION RATE: 14 mm/h (ref 0–15)

## 2021-08-29 LAB — C-REACTIVE PROTEIN: C-REACTIVE PROTEIN: 7 mg/L (ref <=10.0)

## 2021-08-29 MED ORDER — INDOMETHACIN ER 75 MG CAPSULE,EXTENDED RELEASE
ORAL_CAPSULE | Freq: Every day | ORAL | 1 refills | 90.00000 days | Status: CP
Start: 2021-08-29 — End: ?

## 2021-08-29 NOTE — Unmapped (Addendum)
[facility] Rheumatology Clinic Visit    ASSESSMENT/PLAN:    45 y.o. male smoker with prior history of chronic joint pain presenting for follow-up for newly diagnosed ankylosing spondylitis.   ??  Last seen 04/2021 via telemedicine     Currently on Humira 40 mg subcutaneous q14 days (started about 2 months ago) with some improvement in stiffness although reporting ongoing pain and stiffness, particularly in AM.  Not taking NSAID regularly.  Modified Schober with some improvement (0 cm on initial evaluation, 2 cm today), Occiput to wall stable at 10.5 cm.   No uveitis sx.      Ankylosing Spondylitis: Ongoing inflammatory back pain symptoms; patient has only been on therapy for 2 months and not taking scheduled NSAIDs; will optimize NSAID regimen prior to alternating biologics   - Schedule Indomethacin 75mg  PO nightly. Advised GI protection  - Update labs for medication monitoring: CBCw/diff, CMP, ESR, CRP  - Continue Humira 40mg  Cecil every other week.     Fatigue: Various possible etiologies. If this is related to his ankylosing spondylitis, would expect this to improve with Humira.    - Monitor for improvement  - Encouraged exercise, healthy diet, regular sleep  - He is considering starting a daily multivitamin    Tobacco use - counseled cessation again today; patient is not ready to quit    I saw and evaluated the patient, participating in the key portions of the service.?? I reviewed the resident???s note.?? I agree with the resident???s findings and plan.   I personally spent 41 minutes face-to-face and non-face-to-face in the care of this patient, which includes all pre, intra, and post visit time on the date of service.    [name], MD      RTC in 4 months with [name] and 8 months with myself       ____________________________________________________________________________________    REASON FOR VISIT: F/U AS      HPI: Jason Wagner is a 45 y.o. male with prior history of chronic joint pain presenting for follow-up for ankylosing spondylitis.  Clinical course has been complicated by intermittent follow-up and non-adherence to recommended therapies.   ??  Last seen 04/2021 via telemedicine     No Show: 09/12/20, 01/08/21  ??  At last visit, started indomethacin due to no benefit from meloxicam. In February 2022 pt reported limited benefit from indomethacin so ordered Humira which was approved but pharmacy was unable to contact pt to set up delivery after multiple attempts.     Interim History:   Since the last visit, his symptoms initially got pretty good for a little while but then he started noticing the medicines weren't helping so much. It takes the edge off but doesn't totally resolve symptoms. He does notice he can He has been very physically fatigued, and it is hard for him to wake up/get up. He hasn't noticed a set pattern to when he gets fatigue spells. It comes out of nowhere and it will last for 1-2 days. He sleeps along time and that is what helps.    He uses indomethacin, but not everyday. He has been fully compliant with Humira every 2 weeks and has had about 6 doses total. His last dose was 11/27. He feels aching mostly in knees, hips, lower back, and neck. He hasn't noticed a timing or pattern to when he feels pain/aching. He has some good days. He does feel like the medicines are doing something but he is interested in seeing if  he can up the dose. Denies new eye complaints.       Rheumatologic History:  Established care in 10/2019  at which time patient endorsed several year history of back pain in stiffness with recent worsening of symptoms in upper thoracic and cervical spine.  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 Noted to have severely limited Schober's and Occiput to Wall at that time. Lab work notable for mildly elevated ESR and CRP.  HLAB27 +.   ID Screen negative Quant gold, HIV, HCV and HBV serologies. Imaging notable for fusion of b/l sacroiliac joints, L1-L4 facet ankylosis. 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  Based on his history and presentation, high suspicion for ankylosing spondylitis and recommended trial of scheduled NSAID (meloxicam) but remains symptomatic.   Recommended patient start on anti-TNF (Humira) in 12/2019 but this was not started due to prolonged dental infection.     Record Review: Available records were reviewed, including pertinent office visits, labs, and imaging.      REVIEW OF SYSTEMS: Ten system were reviewed and negative except as noted above.    Past Medical History:   Diagnosis Date   ??? Arthritis     Waiting on test results from Rheumotologist appointment 2/17   ??? 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 Headache 2019-2020    Starting getting them after my neck got really bad.         Current Outpatient Medications   Medication Sig Dispense Refill   ??? acetaminophen (TYLENOL) 500 MG tablet Take 500 mg by mouth every six (6) hours as needed for pain.     ??? empty container (SHARPS-A-GATOR DISPOSAL SYSTEM) Misc Use as directed for sharps disposal 1 each 2   ??? empty container Misc Use as directed 1 each 2   ??? HUMIRA PEN CITRATE FREE 40 MG/0.4 ML Inject the contents of 1 pen (40 mg total) under the skin every fourteen (14) days. 6 each 2   ??? indomethacin (INDOCIN SR) 75 mg CR capsule Take 1 capsule (75 mg total) by mouth in the morning. Do not crush or chew. Swallow capsule whole.. 15 capsule 0     No current facility-administered medications for this visit.     Allergies:  No Known Allergies    Surgical History: Unchanged    Social History     Socioeconomic History   ??? Marital status: Unknown   Tobacco Use   ??? Smoking status: Every Day     Packs/day: 1.00     Years: 34.00     Pack years: 34.00     Types: Cigarettes     Start date: 06/23/1987   ??? Smokeless tobacco: Never   Substance and Sexual Activity   ??? Alcohol use: Yes     Comment: 3-4 times a year   ??? Drug use: Never   ??? Sexual activity: Yes Partners: Female     Comment: Hysterectomy   Other Topics Concern   ??? Exercise Yes     Comment: Work   ??? Living Situation Yes     Comment: Love with my family/ wife and 2/6 children     Family History   Problem Relation Age of Onset   ??? Cancer Maternal Grandfather         Brought on by diabetes   ??? Diabetes Maternal Grandfather         State 2 diabetes insulin dependent   ???  Cancer Mother         Lung and breast cancer   ??? Cancer Maternal Grandmother         Leukemia         Objective   Vitals:    08/29/21 1034   BP: 119/75   BP Site: R Arm   BP Position: Sitting   BP Cuff Size: Large   Pulse: 72   Temp: 36.2 ??C (97.2 ??F)   TempSrc: Temporal   Weight: 75.5 kg (166 lb 6.4 oz)   Height: 170.2 cm (5' 7)       Physical Exam  General:   Patient is well appearing, and does not appear to be in any acute distress   Eyes:   PERRLA, EOMI, and sclera anicteric.    Cardiovascular:  normal rate, regular rhythm.  S1 and S2 normal, without any murmur, rub, or gallop.   Pulmonary:  Clear to auscultation bilaterally, without                           Wheezes/crackles/rhonchi.  Good air movement. Normal work of breathing.   Skin:    No rash/lesions/breakdown. Normal turgor and elasticity.    No Raynaud???s phenomenon or digital ulcers.  No purpura, petechiae, telangiectasia or livedo reticularis.   Psychiatry:   Alert and oriented to person, place, and time. Mood and affect appropriate and congruent.   Extremities:   Warm and well perfused. No cyanosis, clubbing    Musculo Skeletal:   Tenderness in neck/shoulders. Limited range of motion in neck, but otherwise full range of motion in shoulder, elbow, hip, knee, ankle, hands and feet. No evidence of active synovitis in the small joints of the hands or feet.    Neurological:  Alert and oriented to person, place and time.  Cranial nerves II-XII grossly intact.  Strength good bilaterally       Testing Results from 08/29/2021   Occiput to Wall:  10.5 cm   Chest expansion:  4.5 cm Schober's:  15 cm --> 17 cm

## 2021-08-29 NOTE — Unmapped (Signed)
You were seen by [facility] Rheumatology today.      [facility]  [address]   [place], [place] [address]  Phone:  [phone]  Fax:  [phone]     Rheumatologist:  Dr. Ilsa Iha       Summary of Plan for 08/29/21    Diagnostic testing recommendations:  Labs today (in clinic or on the first floor of the [employer])    Therapeutic / Treatment recommendations:   Keep taking Humira every other week.   I recommend you scheduling your indomethacin every night    Referrals and follow-up recommendations:  Please follow-up with PCP     When should I expect results?   Please note that it may take up to 21 days for results to return.  If you have not been notified by phone, mail or [facility] (if applicable) in 21 days, please contact our office at [phone] or via [facility] messaging [email or web address])     What do I do if I have questions or concerns after the visit?   If you have non-urgent questions, the best way to contact your provider is to send a MyChart message by visiting [email or web address].  You can also use MyChart to request refills and access test results.  Providers will do their best to respond within 2-3 business days, although it may take longer in some circumstances.  If you have immediate concerns, please contact our clinic by phone  at [phone].     Thank you for allowing [facility] Rheumatology to be involved in your care!

## 2021-09-12 NOTE — Unmapped (Signed)
[facility] Refill Coordination Note    Specialty Medication(s) to be Shipped:   Inflammatory Disorders: Humira    Other medication(s) to be shipped: No additional medications requested for fill at this time     Kaushal Vannice, DOB: August 12, 1976  Phone: [phone] (home)       All above HIPAA information was verified with patient.     Was a Nurse, learning disability used for this call? No    Completed refill call assessment today to schedule patient's medication shipment from the [facility] [phone]).  All relevant notes have been reviewed.     Specialty medication(s) and dose(s) confirmed: Regimen is correct and unchanged.   Changes to medications: Faizaan reports no changes at this time.  Changes to insurance: No  New side effects reported not previously addressed with a pharmacist or physician: None reported  Questions for the pharmacist: No    Confirmed patient received a Conservation officer, historic buildings and a Surveyor, mining with first shipment. The patient will receive a drug information handout for each medication shipped and additional FDA Medication Guides as required.       DISEASE/MEDICATION-SPECIFIC INFORMATION        For patients on injectable medications: Patient currently has 1 doses left.  Next injection is scheduled for 12/24.    SPECIALTY MEDICATION ADHERENCE     Medication Adherence    Patient reported X missed doses in the last month: 0  Specialty Medication: Humira  Patient is on additional specialty medications: No  Patient is on more than two specialty medications: No  Any gaps in refill history greater than 2 weeks in the last 3 months: no  Demonstrates understanding of importance of adherence: yes  Informant: patient              Were doses missed due to medication being on hold? No    Humira 40mg /0.66ml: Patient has 14 days of medication on hand     REFERRAL TO PHARMACIST     Referral to the pharmacist: Not needed      [facility]     Shipping address confirmed in Epic. Delivery Scheduled: Yes, Expected medication delivery date: 12/30.     Medication will be delivered via UPS to the prescription address in Epic WAM.    [name]   [facility] Specialty Technician

## 2021-09-19 MED FILL — HUMIRA PEN CITRATE FREE 40 MG/0.4 ML: SUBCUTANEOUS | 28 days supply | Qty: 2 | Fill #5

## 2021-10-18 NOTE — Unmapped (Signed)
The [facility] has made a second and final attempt to reach this patient to refill the following medication:Humira.      We have been unable to leave messages on the following phone numbers: [phone] [facility].    Dates contacted: 1/19, 1/27  Last scheduled delivery: 12/29    The patient may be at risk of non-compliance with this medication. The patient should call the [facility] at [phone]  Option 4, then Option 2 (all other specialty patients) to refill medication.    Jason Wagner   [facility] Specialty Technician

## 2021-10-22 NOTE — Unmapped (Signed)
[facility] Clinical Assessment & Refill Coordination Note    Jason Wagner, Jason Wagner: November 25, 1975  Phone: [phone] (home)     All above HIPAA information was verified with patient.     Was a Nurse, learning disability used for this call? No    Specialty Medication(s):   Inflammatory Disorders: Humira     Current Outpatient Medications   Medication Sig Dispense Refill   ??? acetaminophen (TYLENOL) 500 MG tablet Take 500 mg by mouth every six (6) hours as needed for pain.     ??? empty container (SHARPS-A-GATOR DISPOSAL SYSTEM) Misc Use as directed for sharps disposal 1 each 2   ??? empty container Misc Use as directed 1 each 2   ??? HUMIRA PEN CITRATE FREE 40 MG/0.4 ML Inject the contents of 1 pen (40 mg total) under the skin every fourteen (14) days. 6 each 2   ??? indomethacin (INDOCIN SR) 75 mg CR capsule Take 1 capsule (75 mg total) by mouth in the morning. Do not crush or chew. Swallow capsule whole.. 90 capsule 1     No current facility-administered medications for this visit.        Changes to medications: Jason Wagner reports no changes at this time.    No Known Allergies    Changes to allergies: No    SPECIALTY MEDICATION ADHERENCE         Medication Adherence    Patient reported X missed doses in the last month: 0  Specialty Medication: Humira q14d  Patient is on additional specialty medications: No  Informant: patient          Specialty medication(s) dose(s) confirmed: Regimen is correct and unchanged.     Are there any concerns with adherence? No    Adherence counseling provided? Not needed    CLINICAL MANAGEMENT AND INTERVENTION      Clinical Benefit Assessment:    Do you feel the medicine is effective or helping your condition? Yes    Clinical Benefit counseling provided? Progress note from 12/8 shows evidence of clinical benefit    Adverse Effects Assessment:    Are you experiencing any side effects? No    Are you experiencing difficulty administering your medicine? No    Quality of Life Assessment:    Quality of Life    Rheumatology  Oncology  Dermatology  Cystic Fibrosis          How many days over the past month did your condition  keep you from your normal activities? For example, brushing your teeth or getting up in the morning. declined to answer--said its helping some    Have you discussed this with your provider? Not needed    Acute Infection Status:    Acute infections noted within Epic:  No active infections  Patient reported infection: None    Therapy Appropriateness:    Is therapy appropriate and patient progressing towards therapeutic goals? Yes, therapy is appropriate and should be continued    DISEASE/MEDICATION-SPECIFIC INFORMATION      For patients on injectable medications: Patient currently has 0 doses left.  Next injection is scheduled for 2/3.    PATIENT SPECIFIC NEEDS     - Does the patient have any physical, cognitive, or cultural barriers? No    - Is the patient high risk? No    - Does the patient require a Care Management Plan? No     SOCIAL DETERMINANTS OF HEALTH     At the [facility], we have  learned that life circumstances - like trouble affording food, housing, utilities, or transportation can affect the health of many of our patients.   That is why we wanted to ask: are you currently experiencing any life circumstances that are negatively impacting your health and/or quality of life? Patient declined to answer    Social Determinants of Health     Food Insecurity: Not on file   Tobacco Use: High Risk   ??? Smoking Tobacco Use: Every Day   ??? Smokeless Tobacco Use: Never   ??? Passive Exposure: Not on file   Transportation Needs: Not on file   Alcohol Use: Not on file   Housing/Utilities: Not on file   Substance Use: Not on file   Financial Resource Strain: Not on file   Physical Activity: Not on file   Health Literacy: Not on file   Stress: Not on file   Intimate Partner Violence: Not on file   Depression: Not on file   Social Connections: Not on file       Would you be willing to receive help with any of the needs that you have identified today? Not applicable       SHIPPING     Specialty Medication(s) to be Shipped:   Inflammatory Disorders: Humira    Other medication(s) to be shipped: No additional medications requested for fill at this time     Changes to insurance: No    Delivery Scheduled: Yes, Expected medication delivery date: 2/2.     Medication will be delivered via UPS to the confirmed prescription address in [facility].    The patient will receive a drug information handout for each medication shipped and additional FDA Medication Guides as required.  Verified that patient has previously received a Conservation officer, historic buildings and a Surveyor, mining.    The patient or caregiver noted above participated in the development of this care plan and knows that they can request review of or adjustments to the care plan at any time.      All of the patient's questions and concerns have been addressed.    Jason Wagner   [facility] Specialty Pharmacist

## 2021-10-23 MED FILL — HUMIRA PEN CITRATE FREE 40 MG/0.4 ML: SUBCUTANEOUS | 28 days supply | Qty: 2 | Fill #6

## 2021-11-15 NOTE — Unmapped (Signed)
[facility] Pharmacy Refill Coordination Note    Specialty Medication(s) to be Shipped:   Inflammatory Disorders: Humira    Other medication(s) to be shipped: No additional medications requested for fill at this time     Jason Wagner, DOB: 1976/04/24  Phone: [phone] (home)       All above HIPAA information was verified with patient.     Was a Nurse, learning disability used for this call? No    Completed refill call assessment today to schedule patient's medication shipment from the [facility] [phone]).  All relevant notes have been reviewed.     Specialty medication(s) and dose(s) confirmed: Regimen is correct and unchanged.   Changes to medications: Jason Wagner reports no changes at this time.  Changes to insurance: No  New side effects reported not previously addressed with a pharmacist or physician: None reported  Questions for the pharmacist: No    Confirmed patient received a Conservation officer, historic buildings and a Surveyor, mining with first shipment. The patient will receive a drug information handout for each medication shipped and additional FDA Medication Guides as required.       DISEASE/MEDICATION-SPECIFIC INFORMATION        For patients on injectable medications: Patient currently has 1 doses left.  Next injection is scheduled for 2/25.    SPECIALTY MEDICATION ADHERENCE     Medication Adherence    Patient reported X missed doses in the last month: 0  Specialty Medication: Humira  Patient is on additional specialty medications: No  Patient is on more than two specialty medications: No  Any gaps in refill history greater than 2 weeks in the last 3 months: no  Demonstrates understanding of importance of adherence: yes  Informant: patient              Were doses missed due to medication being on hold? No    Humira 40mg /0.85ml: Patient has 14 days of medication on hand     REFERRAL TO PHARMACIST     Referral to the pharmacist: Not needed      [facility]     Shipping address confirmed in Epic. Delivery Scheduled: Yes, Expected medication delivery date: 3/2.     Medication will be delivered via UPS to the prescription address in Epic WAM.    Jason Wagner   [facility] Specialty Technician

## 2021-11-20 DIAGNOSIS — M459 Ankylosing spondylitis of unspecified sites in spine: Principal | ICD-10-CM

## 2021-11-20 MED ORDER — HUMIRA PEN CITRATE FREE 40 MG/0.4 ML
SUBCUTANEOUS | 2 refills | 84 days | Status: CP
Start: 2021-11-20 — End: ?
  Filled 2021-11-20: qty 2, 28d supply, fill #0

## 2021-11-20 NOTE — Unmapped (Signed)
Humira Refill  Last Visit Date: 08/29/2021  Next Visit Date: 12/31/2021    Lab Results   Component Value Date    ALT 16 08/29/2021    AST 16 08/29/2021    ALBUMIN 4.7 08/29/2021    CREATININE 0.76 08/29/2021     Lab Results   Component Value Date    WBC 7.8 08/29/2021    HGB 14.9 08/29/2021    HCT 43.1 08/29/2021    PLT 180 08/29/2021     Lab Results   Component Value Date    NEUTROPCT 57.6 08/29/2021    LYMPHOPCT 31.2 08/29/2021    MONOPCT 9.2 08/29/2021    EOSPCT 1.3 08/29/2021    BASOPCT 0.7 08/29/2021

## 2021-12-20 NOTE — Unmapped (Signed)
[facility] Refill Coordination Note    Specialty Medication(s) to be Shipped:   Inflammatory Disorders: Humira    Other medication(s) to be shipped: No additional medications requested for fill at this time     Jason Wagner, DOB: 1976-08-16  Phone: [phone] (home)       All above HIPAA information was verified with patient.     Was a Nurse, learning disability used for this call? No    Completed refill call assessment today to schedule patient's medication shipment from the Jason Wagner [phone]).  All relevant notes have been reviewed.     Specialty medication(s) and dose(s) confirmed: Regimen is correct and unchanged.   Changes to medications: [name] reports no changes at this time.  Changes to insurance: No  New side effects reported not previously addressed with a pharmacist or physician: None reported  Questions for the pharmacist: No    Confirmed patient received a Conservation officer, historic buildings and a Surveyor, mining with first shipment. The patient will receive a drug information handout for each medication shipped and additional FDA Medication Guides as required.       DISEASE/MEDICATION-SPECIFIC INFORMATION        For patients on injectable medications: Patient currently has 1 doses left.  Next injection is scheduled for 4/6.    SPECIALTY MEDICATION ADHERENCE     Medication Adherence    Patient reported X missed doses in the last month: 0  Specialty Medication: Humira  Patient is on additional specialty medications: No  Patient is on more than two specialty medications: No  Any gaps in refill history greater than 2 weeks in the last 3 months: no  Demonstrates understanding of importance of adherence: yes  Informant: patient              Were doses missed due to medication being on hold? No    Humira 40mg /0.72ml: Patient has 14 days of medication on hand    REFERRAL TO PHARMACIST     Referral to the pharmacist: Not needed      Jason Wagner     Shipping address confirmed in Epic. Delivery Scheduled: Yes, Expected medication delivery date: 4/5.     Medication will be delivered via UPS to the prescription address in Epic WAM.    [name]   Jason Wagner Specialty Technician

## 2021-12-24 DIAGNOSIS — M459 Ankylosing spondylitis of unspecified sites in spine: Principal | ICD-10-CM

## 2021-12-24 MED FILL — HUMIRA PEN CITRATE FREE 40 MG/0.4 ML: SUBCUTANEOUS | 28 days supply | Qty: 2 | Fill #1

## 2021-12-30 NOTE — Unmapped (Signed)
ok 

## 2021-12-30 NOTE — Unmapped (Unsigned)
[facility] Rheumatology Clinic Visit    ASSESSMENT/PLAN:    46 y.o. male smoker with prior history of chronic joint pain presenting for follow-up for newly diagnosed ankylosing spondylitis.      Last seen 08/29/2021 with Jason Wagner    ***    Currently on Humira 40 mg subcutaneous q14 days (started about 2 months ago) with some improvement in stiffness although reporting ongoing pain and stiffness, particularly in AM.  Not taking NSAID regularly.  Modified Schober with some improvement (0 cm on initial evaluation, 2 cm today), Occiput to wall stable at 10.5 cm.   No uveitis sx.      Ankylosing Spondylitis: Ongoing inflammatory back pain symptoms; patient has only been on therapy for 2 months and not taking scheduled NSAIDs; will optimize NSAID regimen prior to alternating biologics   - Schedule Indomethacin 75mg  PO nightly. Advised GI protection  - Update labs for medication monitoring: CBCw/diff, CMP, ESR, CRP  - Continue Humira 40mg  [place] every other week.     Fatigue: Various possible etiologies. If this is related to his ankylosing spondylitis, would expect this to improve with Humira.   - Monitor for improvement  - Encouraged exercise, healthy diet, regular sleep  - He is considering starting a daily multivitamin    Tobacco use - counseled cessation again today; patient is not ready to quit    F/U as scheduled in August with Jason Wagner    I personally spent *** minutes face-to-face and non-face-to-face in the care of this patient, which includes all pre, intra, and post visit time on the date of service.  All documented time was specific to the E/M visit and does not include any procedures that may have been performed.      ____________________________________________________________________________________    REASON FOR VISIT: F/U AS      HPI: Jason Wagner is a 46 y.o. male with prior history of chronic joint pain presenting for follow-up for  ankylosing spondylitis.  Clinical course has been complicated by intermittent follow-up and non-adherence to recommended therapies.      Last seen 08/29/2021 with Jason Wagner.  At this visit patient had some improvement after being on Humira for 2 months.    No Show: 09/12/20, 01/08/21    Interim History:     Humira?    Indomethacin?    Back stiffness?  Joints?  Swelling?    Eye symptoms?    Fever?  Illness or infections?    Smoking?          Rheumatologic History:  Established care in 10/2019  at which time patient endorsed several year history of back pain in stiffness with recent worsening of symptoms in upper thoracic and cervical spine.  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 Noted to have severely limited Schober's and Occiput to Wall at that time. Lab work notable for mildly elevated ESR and CRP.  HLAB27 +.   ID Screen negative Quant gold, HIV, HCV and HBV serologies. Imaging notable for fusion of b/l sacroiliac joints, L1-L4 facet ankylosis. 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  Based on his history and presentation, high suspicion for ankylosing spondylitis and recommended trial of scheduled NSAID (meloxicam) but remains symptomatic.   Recommended patient start on anti-TNF (Humira) in 12/2019 but this was not started due to prolonged dental infection.     Record Review: Available records were reviewed, including pertinent office visits, labs, and imaging.      REVIEW OF  SYSTEMS: Ten system were reviewed and negative except as noted above.    Past Medical History:   Diagnosis Date    Arthritis     Waiting on test results from Rheumotologist appointment 2/17    Brain concussion 1987    First of many    Headache 2019-2020    Starting getting them after my neck got really bad.         Current Outpatient Medications   Medication Sig Dispense Refill    acetaminophen (TYLENOL) 500 MG tablet Take 500 mg by mouth every six (6) hours as needed for pain.      empty container (SHARPS-A-GATOR DISPOSAL SYSTEM) Misc Use as directed for sharps disposal 1 each 2    empty container Misc Use as directed 1 each 2    HUMIRA PEN CITRATE FREE 40 MG/0.4 ML Inject the contents of 1 pen (40 mg total) under the skin every fourteen (14) days. 6 each 2    indomethacin (INDOCIN SR) 75 mg CR capsule Take 1 capsule (75 mg total) by mouth in the morning. Do not crush or chew. Swallow capsule whole.. 90 capsule 1     No current facility-administered medications for this visit.     Allergies:  No Known Allergies    Surgical History: Unchanged    Social History     Socioeconomic History    Marital status: Unknown   Tobacco Use    Smoking status: Every Day     Packs/day: 1.00     Years: 34.00     Pack years: 34.00     Types: Cigarettes     Start date: 06/23/1987    Smokeless tobacco: Never   Substance and Sexual Activity    Alcohol use: Yes     Comment: 3-4 times a year    Drug use: Never    Sexual activity: Yes     Partners: Female     Comment: Hysterectomy   Other Topics Concern    Exercise Yes     Comment: Work    Living Situation Yes     Comment: Love with my family/ wife and 2/6 children     Family History   Problem Relation Age of Onset    Cancer Maternal Grandfather         Brought on by diabetes    Diabetes Maternal Grandfather         State 2 diabetes insulin dependent    Cancer Mother         Lung and breast cancer    Cancer Maternal Grandmother         Leukemia         Objective   There were no vitals filed for this visit.    Physical Exam***  General:   Patient is well appearing, and does not appear to be in any acute distress   Eyes:   PERRLA, EOMI, and sclera anicteric.    Cardiovascular:  normal rate, regular rhythm.  S1 and S2 normal, without any murmur, rub, or gallop.   Pulmonary:  Clear to auscultation bilaterally, without                           Wheezes/crackles/rhonchi.  Good air movement. Normal work of breathing.   Skin:    No rash/lesions/breakdown. Normal turgor and elasticity.    No Raynaud???s phenomenon or digital ulcers. No purpura, petechiae, telangiectasia or livedo reticularis.   Psychiatry:  Alert and oriented to person, place, and time. Mood and affect appropriate and congruent.   Extremities:   Warm and well perfused. No cyanosis, clubbing    Musculo Skeletal:   Tenderness in neck/shoulders. Limited range of motion in neck, but otherwise full range of motion in shoulder, elbow, hip, knee, ankle, hands and feet. No evidence of active synovitis in the small joints of the hands or feet.    Neurological:  Alert and oriented to person, place and time.  Cranial nerves II-XII grossly intact.  Strength good bilaterally       Testing Results from 08/29/2021   Occiput to Wall:  10.5 cm   Chest expansion:  4.5 cm   Schober's:  15 cm --> 17 cm

## 2021-12-30 NOTE — Unmapped (Signed)
Jason Wagner pt said he needed to speak to you. he would not elaborate. please call

## 2022-01-24 NOTE — Unmapped (Signed)
[facility] Refill Coordination Note    Specialty Medication(s) to be Shipped:   Inflammatory Disorders: Humira    Other medication(s) to be shipped: No additional medications requested for fill at this time     Jason Wagner, DOB: May 30, 1976  Phone: [phone] (home)       All above HIPAA information was verified with patient.     Was a Nurse, learning disability used for this call? No    Completed refill call assessment today to schedule patient's medication shipment from the [facility] [phone]).  All relevant notes have been reviewed.     Specialty medication(s) and dose(s) confirmed: Regimen is correct and unchanged.   Changes to medications: Jason Wagner reports no changes at this time.  Changes to insurance: No  New side effects reported not previously addressed with a pharmacist or physician: None reported  Questions for the pharmacist: No    Confirmed patient received a Conservation officer, historic buildings and a Surveyor, mining with first shipment. The patient will receive a drug information handout for each medication shipped and additional FDA Medication Guides as required.       DISEASE/MEDICATION-SPECIFIC INFORMATION        For patients on injectable medications: Patient currently has 1 doses left.  Next injection is scheduled for 5/9.    SPECIALTY MEDICATION ADHERENCE     Medication Adherence    Patient reported X missed doses in the last month: 0  Specialty Medication: Humira  Patient is on additional specialty medications: No  Patient is on more than two specialty medications: No  Any gaps in refill history greater than 2 weeks in the last 3 months: no  Demonstrates understanding of importance of adherence: yes  Informant: patient              Were doses missed due to medication being on hold? No    Humira 40mg /0.43ml: Patient has 14 days of medication on hand    REFERRAL TO PHARMACIST     Referral to the pharmacist: Not needed      [facility]     Shipping address confirmed in Epic. Delivery Scheduled: Yes, Expected medication delivery date: 5/11.     Medication will be delivered via UPS to the prescription address in Epic WAM.    Jason Wagner   [facility] Specialty Technician

## 2022-01-28 NOTE — Unmapped (Signed)
[facility] Rheumatology Clinic Visit    ASSESSMENT/PLAN:    46 y.o. male smoker with prior history of chronic joint pain presenting for follow-up for newly diagnosed ankylosing spondylitis.      Last seen 08/29/2021 with Dr. Ilsa Iha    ***    Currently on Humira 40 mg subcutaneous q14 days (started about 2 months ago) with some improvement in stiffness although reporting ongoing pain and stiffness, particularly in AM.  Not taking NSAID regularly.  Modified Schober with some improvement (0 cm on initial evaluation, 2 cm today), Occiput to wall stable at 10.5 cm.   No uveitis sx.      Ankylosing Spondylitis: Ongoing inflammatory back pain symptoms; patient has only been on therapy for 2 months and not taking scheduled NSAIDs; will optimize NSAID regimen prior to alternating biologics   - Schedule Indomethacin 75mg  PO nightly. Advised GI protection  - Update labs for medication monitoring: CBCw/diff, CMP, ESR, CRP  - Continue Humira 40mg  [place] every other week.     Fatigue: Various possible etiologies. If this is related to his ankylosing spondylitis, would expect this to improve with Humira.   - Monitor for improvement  - Encouraged exercise, healthy diet, regular sleep  - He is considering starting a daily multivitamin    Tobacco use - counseled cessation again today; patient is not ready to quit    F/U as scheduled in August with Dr. Ilsa Iha    I personally spent *** minutes face-to-face and non-face-to-face in the care of this patient, which includes all pre, intra, and post visit time on the date of service.  All documented time was specific to the E/M visit and does not include any procedures that may have been performed.      ____________________________________________________________________________________    REASON FOR VISIT: F/U AS      HPI: Jason Wagner is a 46 y.o. male with prior history of chronic joint pain presenting for follow-up for  ankylosing spondylitis.  Clinical course has been complicated by intermittent follow-up and non-adherence to recommended therapies.      Last seen 08/29/2021 with Dr. Ilsa Iha.  At this visit patient had some improvement after being on Humira for 2 months.    No Show: 09/12/20, 01/08/21    Interim History:     Humira?    Indomethacin?    Back stiffness?  Joints?  Swelling?    Eye symptoms?    Fever?  Illness or infections?    Smoking?          Rheumatologic History:  Established care in 10/2019  at which time patient endorsed several year history of back pain in stiffness with recent worsening of symptoms in upper thoracic and cervical spine.  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 Noted to have severely limited Schober's and Occiput to Wall at that time. Lab work notable for mildly elevated ESR and CRP.  HLAB27 +.   ID Screen negative Quant gold, HIV, HCV and HBV serologies. Imaging notable for fusion of b/l sacroiliac joints, L1-L4 facet ankylosis. 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  Based on his history and presentation, high suspicion for ankylosing spondylitis and recommended trial of scheduled NSAID (meloxicam) but remains symptomatic.   Recommended patient start on anti-TNF (Humira) in 12/2019 but this was not started due to prolonged dental infection.     Record Review: Available records were reviewed, including pertinent office visits, labs, and imaging.      REVIEW OF  SYSTEMS: Ten system were reviewed and negative except as noted above.    Past Medical History:   Diagnosis Date    Arthritis     Waiting on test results from Rheumotologist appointment 2/17    Brain concussion 1987    First of many    Headache 2019-2020    Starting getting them after my neck got really bad.         Current Outpatient Medications   Medication Sig Dispense Refill    acetaminophen (TYLENOL) 500 MG tablet Take 500 mg by mouth every six (6) hours as needed for pain.      empty container (SHARPS-A-GATOR DISPOSAL SYSTEM) Misc Use as directed for sharps disposal 1 each 2    empty container Misc Use as directed 1 each 2    HUMIRA PEN CITRATE FREE 40 MG/0.4 ML Inject the contents of 1 pen (40 mg total) under the skin every fourteen (14) days. 6 each 2    indomethacin (INDOCIN SR) 75 mg CR capsule Take 1 capsule (75 mg total) by mouth in the morning. Do not crush or chew. Swallow capsule whole.. 90 capsule 1     No current facility-administered medications for this visit.     Allergies:  No Known Allergies    Surgical History: Unchanged    Social History     Socioeconomic History    Marital status: Unknown   Tobacco Use    Smoking status: Every Day     Packs/day: 1.00     Years: 34.00     Pack years: 34.00     Types: Cigarettes     Start date: 06/23/1987    Smokeless tobacco: Never   Substance and Sexual Activity    Alcohol use: Yes     Comment: 3-4 times a year    Drug use: Never    Sexual activity: Yes     Partners: Female     Comment: Hysterectomy   Other Topics Concern    Exercise Yes     Comment: Work    Living Situation Yes     Comment: Love with my family/ wife and 2/6 children     Family History   Problem Relation Age of Onset    Cancer Maternal Grandfather         Brought on by diabetes    Diabetes Maternal Grandfather         State 2 diabetes insulin dependent    Cancer Mother         Lung and breast cancer    Cancer Maternal Grandmother         Leukemia         Objective   There were no vitals filed for this visit.    Physical Exam***  General:   Patient is well appearing, and does not appear to be in any acute distress   Eyes:   PERRLA, EOMI, and sclera anicteric.    Cardiovascular:  normal rate, regular rhythm.  S1 and S2 normal, without any murmur, rub, or gallop.   Pulmonary:  Clear to auscultation bilaterally, without                           Wheezes/crackles/rhonchi.  Good air movement. Normal work of breathing.   Skin:    No rash/lesions/breakdown. Normal turgor and elasticity.    No Raynaud???s phenomenon or digital ulcers. Wheezes/crackles/rhonchi.  Good air movement. Normal work of breathing.   Skin:  No rash/lesions/breakdown. Normal turgor and elasticity.    No Raynaud???s phenomenon or digital ulcers.  No purpura, petechiae, telangiectasia or livedo reticularis.   Psychiatry:   Alert and oriented to person, place, and time. Mood and affect appropriate and congruent.   Extremities:   Warm and well perfused. No cyanosis, clubbing    Musculo Skeletal:   Tenderness in neck/shoulders. Limited range of motion in neck, but otherwise full range of motion in shoulder, elbow, hip, knee, ankle, hands and feet. No evidence of active synovitis in the small joints of the hands or feet.   Occiput to wall: 10.5cm  Modfied Schober's: 15cm--> 16cm   Neurological:  Alert and oriented to person, place and time.  Cranial nerves II-XII grossly intact.  Strength good bilaterally     Testing Results from 08/29/2021   Occiput to Wall:  10.5 cm   Chest expansion:  4.5 cm   Schober's:  15 cm --> 17 cm

## 2022-01-29 ENCOUNTER — Ambulatory Visit: Admit: 2022-01-29 | Discharge: 2022-01-29 | Payer: PRIVATE HEALTH INSURANCE

## 2022-01-29 ENCOUNTER — Ambulatory Visit: Admit: 2022-01-29 | Discharge: 2022-01-29 | Payer: PRIVATE HEALTH INSURANCE | Attending: Family | Primary: Family

## 2022-01-29 DIAGNOSIS — M2550 Pain in unspecified joint: Principal | ICD-10-CM

## 2022-01-29 DIAGNOSIS — M459 Ankylosing spondylitis of unspecified sites in spine: Principal | ICD-10-CM

## 2022-01-29 MED ORDER — HUMIRA PEN CITRATE FREE 40 MG/0.4 ML
SUBCUTANEOUS | 3 refills | 84 days | Status: CP
Start: 2022-01-29 — End: 2023-01-29
  Filled 2022-01-29: qty 2, 28d supply, fill #2
  Filled 2022-02-25: qty 4, 28d supply, fill #0

## 2022-01-29 NOTE — Unmapped (Signed)
Your provider today was [name], NP    Thank you for letting us be involved with your care!    Today we discussed the following:    Please see your local eye doctor. I will send in the increase of your Humira and the pharmacy will let you know once this is approved.     X-rays today.  It can take 10-14 days for all of the test results to come back. I will send you a MyChart message when they are complete.   If you have non-urgent questions, the best way to contact us is to send a MyChart message by visiting [email or web address].  You can also use MyChart to request refills and access test results.  I will do my best to respond within 2 business days, however occasionally it may take longer. If you have immediate concerns, please contact our clinic by phone [phone].

## 2022-02-21 NOTE — Unmapped (Signed)
[facility] Refill Coordination Note    Specialty Medication(s) to be Shipped:   Inflammatory Disorders: Humira    Other medication(s) to be shipped: No additional medications requested for fill at this time     Jason Wagner, DOB: 1976-06-19  Phone: [phone] (home)       All above HIPAA information was verified with patient.     Was a Nurse, learning disability used for this call? No    Completed refill call assessment today to schedule patient's medication shipment from the [facility] [phone]).  All relevant notes have been reviewed.     Specialty medication(s) and dose(s) confirmed: Patient reports changes to the regimen as follows: Humira 1 pen every 7 days    Changes to medications: Jason Wagner reports no changes at this time.  Changes to insurance: No  New side effects reported not previously addressed with a pharmacist or physician: None reported  Questions for the pharmacist: No    Confirmed patient received a Conservation officer, historic buildings and a Surveyor, mining with first shipment. The patient will receive a drug information handout for each medication shipped and additional FDA Medication Guides as required.       DISEASE/MEDICATION-SPECIFIC INFORMATION        For patients on injectable medications: Patient currently has 1 doses left.  Next injection is scheduled for 02/26/22.    SPECIALTY MEDICATION ADHERENCE     Medication Adherence    Patient reported X missed doses in the last month: 0  Specialty Medication: HUMIRA(CF) PEN 40 mg/0.4 mL  Patient is on additional specialty medications: No              Were doses missed due to medication being on hold? No    Humira 40/0.4 mg/ml: 5 days of medicine on hand        REFERRAL TO PHARMACIST     Referral to the pharmacist: Not needed      [facility]     Shipping address confirmed in Epic.     Delivery Scheduled: Yes, Expected medication delivery date: 02/26/22.     Medication will be delivered via UPS to the prescription address in Epic WAM.    Unk Lightning   [facility] Specialty Technician

## 2022-03-21 NOTE — Unmapped (Signed)
[facility] Refill Coordination Note    Specialty Medication(s) to be Shipped:   Inflammatory Disorders: Humira    Other medication(s) to be shipped: No additional medications requested for fill at this time     Jason Wagner, DOB: Oct 06, 1975  Phone: [phone] (home)       All above HIPAA information was verified with patient.     Was a Nurse, learning disability used for this call? No    Completed refill call assessment today to schedule patient's medication shipment from the [facility] [phone]).  All relevant notes have been reviewed.     Specialty medication(s) and dose(s) confirmed: Regimen is correct and unchanged.   Changes to medications: Jason Wagner reports no changes at this time.  Changes to insurance: No  New side effects reported not previously addressed with a pharmacist or physician: None reported  Questions for the pharmacist: No    Confirmed patient received a Conservation officer, historic buildings and a Surveyor, mining with first shipment. The patient will receive a drug information handout for each medication shipped and additional FDA Medication Guides as required.       DISEASE/MEDICATION-SPECIFIC INFORMATION        For patients on injectable medications: Patient currently has 1 doses left.  Next injection is scheduled for 7/5.    SPECIALTY MEDICATION ADHERENCE     Medication Adherence    Patient reported X missed doses in the last month: 0  Specialty Medication: Humira  Patient is on additional specialty medications: No  Patient is on more than two specialty medications: No  Any gaps in refill history greater than 2 weeks in the last 3 months: no  Demonstrates understanding of importance of adherence: yes  Informant: patient              Were doses missed due to medication being on hold? No    Humira 40mg /0.40ml; Patient has 7 days of medication on hand    REFERRAL TO PHARMACIST     Referral to the pharmacist: Not needed      [facility]     Shipping address confirmed in Epic. Delivery Scheduled: Yes, Expected medication delivery date: 7/7.     Medication will be delivered via UPS to the prescription address in Epic WAM.    [name]   [facility] Specialty Technician

## 2022-03-27 MED FILL — HUMIRA PEN CITRATE FREE 40 MG/0.4 ML: SUBCUTANEOUS | 28 days supply | Qty: 4 | Fill #1

## 2022-04-16 NOTE — Unmapped (Addendum)
[facility] Clinical Assessment & Refill Coordination Note    Jason Wagner, [place]: 05-30-1976  Phone: [phone] (home)     All above HIPAA information was verified with patient.     Was a Nurse, learning disability used for this call? No    Specialty Medication(s):   Inflammatory Disorders: Humira     Current Outpatient Medications   Medication Sig Dispense Refill   ??? acetaminophen (TYLENOL) 500 MG tablet Take 500 mg by mouth every six (6) hours as needed for pain.     ??? empty container (SHARPS-A-GATOR DISPOSAL SYSTEM) Misc Use as directed for sharps disposal 1 each 2   ??? empty container Misc Use as directed 1 each 2   ??? HUMIRA PEN CITRATE FREE 40 MG/0.4 ML Inject the contents of 1 pen (40 mg total) under the skin every seven (7) days. 12 each 3     No current facility-administered medications for this visit.        Changes to medications: Jason Wagner reports no changes at this time.    No Known Allergies    Changes to allergies: No    SPECIALTY MEDICATION ADHERENCE       Medication Adherence    Patient reported X missed doses in the last month: 0  Specialty Medication: Humira q week  Patient is on additional specialty medications: No  Informant: patient          Specialty medication(s) dose(s) confirmed: Regimen is correct and unchanged.     Are there any concerns with adherence? No    Adherence counseling provided? Not needed    CLINICAL MANAGEMENT AND INTERVENTION      Clinical Benefit Assessment:  Do you feel the medicine is effective or helping your condition? Yes.  Helping some but still has sore days.  Knees have gotten a whole lot worse since last appt in May.  It has locked up several times and fallen because of it and have a ton of joint pain. The pain has kept him up most nights.  Has an ortho appointment on 8/19.     Clinical Benefit counseling provided? consulted provider regarding clinical benefit concerns.  Patient has an appt on 8/9     Adverse Effects Assessment:    Are you experiencing any side effects? No    Are you experiencing difficulty administering your medicine? No    Quality of Life Assessment:    Quality of Life    Rheumatology  1. What impact has your specialty medication had on the reduction of your daily pain level?: Some  2. What impact has your specialty medication had on your ability to complete daily tasks (prepare meals, get dressed, etc...)?: Some  Oncology  Dermatology  Cystic Fibrosis          How many days over the past month did your ankyosing spondylitis  keep you from your normal activities? For example, brushing your teeth or getting up in the morning. still has pain in knees    Have you discussed this with your provider? Yes    Acute Infection Status:    Acute infections noted within Epic:  No active infections  Patient reported infection: None    Therapy Appropriateness:    Is therapy appropriate and patient progressing towards therapeutic goals? Pharmacist will consult provider    DISEASE/MEDICATION-SPECIFIC INFORMATION      For patients on injectable medications: Patient currently has 1 doses left.  Next injection is scheduled for 8/2.  PATIENT SPECIFIC NEEDS     - Does the patient have any physical, cognitive, or cultural barriers? No    - Is the patient high risk? No    - Does the patient require a Care Management Plan? No     SOCIAL DETERMINANTS OF HEALTH     At the [facility], we have learned that life circumstances - like trouble affording food, housing, utilities, or transportation can affect the health of many of our patients.   That is why we wanted to ask: are you currently experiencing any life circumstances that are negatively impacting your health and/or quality of life? Patient declined to answer    Social Determinants of Health     Financial Resource Strain: Not on file   Internet Connectivity: Not on file   Food Insecurity: Not on file   Tobacco Use: High Risk   ??? Smoking Tobacco Use: Every Day   ??? Smokeless Tobacco Use: Never   ??? Passive Exposure: Not on file   Housing/Utilities: Not on file   Alcohol Use: Not on file   Transportation Needs: Not on file   Substance Use: Not on file   Health Literacy: Not on file   Physical Activity: Not on file   Interpersonal Safety: Not on file   Stress: Not on file   Intimate Partner Violence: Not on file   Depression: Not on file   Social Connections: Not on file       Would you be willing to receive help with any of the needs that you have identified today? Not applicable       SHIPPING     Specialty Medication(s) to be Shipped:   Inflammatory Disorders: Humira    Other medication(s) to be shipped: No additional medications requested for fill at this time     Changes to insurance: No    Delivery Scheduled: Yes, Expected medication delivery date: 8/1.     Medication will be delivered via UPS to the confirmed prescription address in [facility].    The patient will receive a drug information handout for each medication shipped and additional FDA Medication Guides as required.  Verified that patient has previously received a Conservation officer, historic buildings and a Surveyor, mining.    The patient or caregiver noted above participated in the development of this care plan and knows that they can request review of or adjustments to the care plan at any time.      All of the patient's questions and concerns have been addressed.    Julianne Rice   [facility] Specialty Pharmacist

## 2022-04-21 MED FILL — HUMIRA PEN CITRATE FREE 40 MG/0.4 ML: SUBCUTANEOUS | 28 days supply | Qty: 4 | Fill #2

## 2022-05-16 NOTE — Unmapped (Signed)
[facility] Refill Coordination Note    Specialty Medication(s) to be Shipped:   Inflammatory Disorders: Humira    Other medication(s) to be shipped: No additional medications requested for fill at this time     Jason Wagner, DOB: December 16, 1975  Phone: [phone] (home)       All above HIPAA information was verified with patient.     Was a Nurse, learning disability used for this call? No    Completed refill call assessment today to schedule patient's medication shipment from the [facility] [phone]).  All relevant notes have been reviewed.     Specialty medication(s) and dose(s) confirmed: Regimen is correct and unchanged.   Changes to medications: Jason Wagner reports no changes at this time.  Changes to insurance: No  New side effects reported not previously addressed with a pharmacist or physician: None reported  Questions for the pharmacist: No    Confirmed patient received a Conservation officer, historic buildings and a Surveyor, mining with first shipment. The patient will receive a drug information handout for each medication shipped and additional FDA Medication Guides as required.       DISEASE/MEDICATION-SPECIFIC INFORMATION        For patients on injectable medications: Patient currently has 1 doses left.  Next injection is scheduled for 05/21/22.    SPECIALTY MEDICATION ADHERENCE     Medication Adherence    Patient reported X missed doses in the last month: 0  Specialty Medication: Humira  Patient is on additional specialty medications: No                          Were doses missed due to medication being on hold? No    Humira 40/0.4 mg/ml: 5 days of medicine on hand        REFERRAL TO PHARMACIST     Referral to the pharmacist: Not needed      [facility]     Shipping address confirmed in Epic.     Delivery Scheduled: Yes, Expected medication delivery date: 05/21/22.     Medication will be delivered via UPS to the prescription address in Epic WAM.    Jason Wagner   [facility] Specialty Technician

## 2022-05-19 NOTE — Unmapped (Signed)
[facility] Visit    ASSESSMENT/PLAN:    46 y.o. male smoker with prior history of chronic joint pain presenting for follow-up for newly diagnosed ankylosing spondylitis.      Last seen 01/29/22 with myself.     Currently on Humira 40 mg subcutaneous weekly (started 06/2021; increased to weekly 01/2022). Today he reports overall improvement but continues to have low back pain and stiffness. Some of his back pain sounds mechanical in nature though. Reassuringly occiput to wall and Schober are stable on exam today. Discussed taking NSAID more regularly to help with pain. Will update imaging today to monitor for progression.     Ankylosing Spondylitis:   - Update monitoring labs: CBCw/diff, Crt, AST, ALT, ESR, CRP  - Continue Humira 40mg  [name] weekly.   - Update hands, SI and [place] spine xray today to monitor for disease progression. If progression will consider switching agent. 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   - Ibuprofen 800mg  BID PRN. Instructed to take with food. Pt unable to tolerated indomethacin or meloxicam previously.     Tobacco use - currently smoking 1/2pk/dy; congratulated pt on decrease but advised cessation.     F/U in 6 months with Dr. Ilsa Iha    I personally spent 40 minutes face-to-face and non-face-to-face in the care of this patient, which includes all pre, intra, and post visit time on the date of service.  All documented time was specific to the E/M visit and does not include any procedures that may have been performed.    ____________________________________________________________________________________    REASON FOR VISIT: F/U AS      HPI: Jason Wagner is a 46 y.o. male with prior history of chronic joint pain presenting for follow-up for  ankylosing spondylitis.  Clinical course has been complicated by intermittent follow-up and non-adherence to recommended therapies. Last seen 01/29/22 with myself. Humira increased to weekly at this visit due to AM stiffness >45 min and decrease in modified schober.     No Show: 09/12/20, 01/08/21    Interim History:   Pt presents for f/u today. He feels increasing Humira to weekly has been helpful but continues to have soreness and pain to low back throughout the day. This does worsen with movement. He reports AM stiffness lasting a few hours though. He discovered he had torn meniscus to R knee and has been seeing Ortho about this. He was given brace to use and suggested to start PT. He has upcoming f/u with them. He reports some stiffness and pain to hands. He reports occasional swelling to knuckles. He takes ibuprofen some but not regularly. He didn't tolerate meloxicam and indomethacin. He reports eyes as stable. He is smoking 1/2 pack/day. No recent fever, illness or infections.     Rheumatologic History:  Established care in 10/2019  at which time patient endorsed several year history of back pain in stiffness with recent worsening of symptoms in upper thoracic and cervical spine.  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 Noted to have severely limited Schober's and Occiput to Wall at that time. Lab work notable for mildly elevated ESR and CRP.  HLAB27 +.   ID Screen negative Quant gold, HIV, HCV and HBV serologies. Imaging notable for fusion of b/l sacroiliac joints, L1-L4 facet ankylosis. 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  Based on his history and presentation,  high suspicion for ankylosing spondylitis and recommended trial of scheduled NSAID (meloxicam) but remains symptomatic.   Recommended patient start on anti-TNF (Humira) in 12/2019 but this was not started due to prolonged dental infection.     Record Review: Available records were reviewed, including pertinent office visits, labs, and imaging.      REVIEW OF SYSTEMS: Ten system were reviewed and negative except as noted above.    Past Medical History:   Diagnosis Date    Arthritis     Waiting on test results from Rheumotologist appointment 2/17    Brain concussion 1987    First of many    Headache 2019-2020    Starting getting them after my neck got really bad.         Current Outpatient Medications   Medication Sig Dispense Refill    acetaminophen (TYLENOL) 500 MG tablet Take 500 mg by mouth every six (6) hours as needed for pain.      empty container (SHARPS-A-GATOR DISPOSAL SYSTEM) Misc Use as directed for sharps disposal 1 each 2    empty container Misc Use as directed 1 each 2    HUMIRA PEN CITRATE FREE 40 MG/0.4 ML Inject the contents of 1 pen (40 mg total) under the skin every seven (7) days. 12 each 3    ibuprofen (MOTRIN) 800 MG tablet Take 1 tablet (800 mg total) by mouth Three (3) times a day.       No current facility-administered medications for this visit.     Allergies:  No Known Allergies    Surgical History: Unchanged    Social History     Socioeconomic History    Marital status: Unknown   Tobacco Use    Smoking status: Every Day     Packs/day: 1.00     Years: 34.00     Additional pack years: 0.00     Total pack years: 34.00     Types: Cigarettes     Start date: 06/23/1987    Smokeless tobacco: Never   Substance and Sexual Activity    Alcohol use: Yes     Comment: 3-4 times a year    Drug use: Never    Sexual activity: Yes     Partners: Female     Comment: Hysterectomy   Other Topics Concern    Exercise Yes     Comment: Work    Living Situation Yes     Comment: Love with my family/ wife and 2/6 children     Family History   Problem Relation Age of Onset    Cancer Maternal Grandfather         Brought on by diabetes    Diabetes Maternal Grandfather         State 2 diabetes insulin dependent    Cancer Mother         Lung and breast cancer    Cancer Maternal Grandmother         Leukemia         Objective   Vitals:    05/21/22 1211   BP: 126/70   Pulse: 82   Temp: 37 ??C (98.6 ??F)   Weight: 88.9 kg (196 lb)   Height: 172.7 cm (5' 8)         Physical Exam:  General:   Patient is well appearing, and does not appear to be in any acute distress   Eyes:   PERRLA, EOMI, and sclera anicteric.    Cardiovascular:  normal  rate, regular rhythm.  S1 and S2 normal, without any murmur, rub, or gallop.   Pulmonary:  Clear to auscultation bilaterally, without                           Wheezes/crackles/rhonchi.  Good air movement. Normal work of breathing.   Skin:    No rash/lesions/breakdown. Normal turgor and elasticity.    No Raynaud???s phenomenon or digital ulcers.  No purpura, petechiae, telangiectasia or livedo reticularis.   Psychiatry:   Alert and oriented to person, place, and time. Mood and affect appropriate and congruent.   Extremities:   Warm and well perfused. No cyanosis, clubbing    Musculo Skeletal:   Tenderness in neck/shoulders. Limited range of motion in neck, but otherwise full range of motion in elbow, hip, knee, ankle, hands and feet. Tender to b/l PIPs w/o swelling. B/L shoulders w/ dec ROM over head. No evidence of active synovitis in the small joints of the hands or feet. Tender to midline low back.    Neurological:  Alert and oriented to person, place and time.  Cranial nerves II-XII grossly intact.  Strength good bilaterally     05/21/22  Occiput to wall: 9.5cm  Modified schober's: 15cm --> 17cm    Testing Results from 08/29/2021   Occiput to Wall:  10.5 cm   Chest expansion:  4.5 cm   Schober's:  15 cm --> 17 cm

## 2022-05-20 MED FILL — HUMIRA PEN CITRATE FREE 40 MG/0.4 ML: SUBCUTANEOUS | 28 days supply | Qty: 4 | Fill #3

## 2022-05-21 ENCOUNTER — Ambulatory Visit: Admit: 2022-05-21 | Discharge: 2022-05-21 | Payer: PRIVATE HEALTH INSURANCE

## 2022-05-21 ENCOUNTER — Ambulatory Visit: Admit: 2022-05-21 | Discharge: 2022-05-21 | Payer: PRIVATE HEALTH INSURANCE | Attending: Family | Primary: Family

## 2022-05-21 DIAGNOSIS — M459 Ankylosing spondylitis of unspecified sites in spine: Principal | ICD-10-CM

## 2022-05-21 DIAGNOSIS — Z79899 Other long term (current) drug therapy: Principal | ICD-10-CM

## 2022-05-21 LAB — CBC W/ AUTO DIFF
BASOPHILS ABSOLUTE COUNT: 0.1 10*9/L (ref 0.0–0.1)
BASOPHILS RELATIVE PERCENT: 0.8 %
EOSINOPHILS ABSOLUTE COUNT: 0.1 10*9/L (ref 0.0–0.5)
EOSINOPHILS RELATIVE PERCENT: 1.2 %
HEMATOCRIT: 41.9 % (ref 39.0–48.0)
HEMOGLOBIN: 14.6 g/dL (ref 12.9–16.5)
LYMPHOCYTES ABSOLUTE COUNT: 2.9 10*9/L (ref 1.1–3.6)
LYMPHOCYTES RELATIVE PERCENT: 27.4 %
MEAN CORPUSCULAR HEMOGLOBIN CONC: 34.9 g/dL (ref 32.0–36.0)
MEAN CORPUSCULAR HEMOGLOBIN: 31.7 pg (ref 25.9–32.4)
MEAN CORPUSCULAR VOLUME: 90.8 fL (ref 77.6–95.7)
MEAN PLATELET VOLUME: 7.2 fL (ref 6.8–10.7)
MONOCYTES ABSOLUTE COUNT: 0.9 10*9/L — ABNORMAL HIGH (ref 0.3–0.8)
MONOCYTES RELATIVE PERCENT: 8.2 %
NEUTROPHILS ABSOLUTE COUNT: 6.6 10*9/L (ref 1.8–7.8)
NEUTROPHILS RELATIVE PERCENT: 62.4 %
NUCLEATED RED BLOOD CELLS: 0 /100{WBCs} (ref <=4)
PLATELET COUNT: 206 10*9/L (ref 150–450)
RED BLOOD CELL COUNT: 4.62 10*12/L (ref 4.26–5.60)
RED CELL DISTRIBUTION WIDTH: 14.2 % (ref 12.2–15.2)
WBC ADJUSTED: 10.5 10*9/L (ref 3.6–11.2)

## 2022-05-21 LAB — AST: AST (SGOT): 14 U/L (ref <=34)

## 2022-05-21 LAB — ALT: ALT (SGPT): 17 U/L (ref 10–49)

## 2022-05-21 LAB — C-REACTIVE PROTEIN: C-REACTIVE PROTEIN: 11 mg/L — ABNORMAL HIGH (ref <=10.0)

## 2022-05-21 LAB — CREATININE
CREATININE: 0.9 mg/dL
EGFR CKD-EPI (2021) MALE: >90 mL/min/{1.73_m2} (ref >=60)

## 2022-05-21 LAB — SEDIMENTATION RATE: ERYTHROCYTE SEDIMENTATION RATE: 20 mm/h — ABNORMAL HIGH (ref 0–15)

## 2022-05-21 MED ORDER — IBUPROFEN 800 MG TABLET
ORAL_TABLET | Freq: Two times a day (BID) | ORAL | 0 refills | 90 days | Status: CP | PRN
Start: 2022-05-21 — End: 2023-05-21

## 2022-06-18 NOTE — Unmapped (Signed)
[facility] Refill Coordination Note    Specialty Medication(s) to be Shipped:   Inflammatory Disorders: Humira    Other medication(s) to be shipped: No additional medications requested for fill at this time     Jason Wagner, DOB: December 02, 1975  Phone: [phone] (home)       All above HIPAA information was verified with patient.     Was a Nurse, learning disability used for this call? No    Completed refill call assessment today to schedule patient's medication shipment from the [facility] [phone]).  All relevant notes have been reviewed.     Specialty medication(s) and dose(s) confirmed: Regimen is correct and unchanged.   Changes to medications: Geo reports no changes at this time.  Changes to insurance: No  New side effects reported not previously addressed with a pharmacist or physician: None reported  Questions for the pharmacist: No    Confirmed patient received a Conservation officer, historic buildings and a Surveyor, mining with first shipment. The patient will receive a drug information handout for each medication shipped and additional FDA Medication Guides as required.       DISEASE/MEDICATION-SPECIFIC INFORMATION        For patients on injectable medications: Patient currently has 1 doses left.  Next injection is scheduled for 06/25/22.    SPECIALTY MEDICATION ADHERENCE     Medication Adherence    Patient reported X missed doses in the last month: 0  Specialty Medication: Humria (CF) 40mg /0.12ml  Patient is on additional specialty medications: No  Patient is on more than two specialty medications: No                          Were doses missed due to medication being on hold? No    Humira (CF) 40mg / 0.70ml: 7 days of medicine on hand       REFERRAL TO PHARMACIST     Referral to the pharmacist: Not needed      [facility]     Shipping address confirmed in Epic.     Delivery Scheduled: Yes, Expected medication delivery date: 06/24/22.     Medication will be delivered via UPS to the prescription address in Epic WAM.    Nancy Nordmann [facility] Specialty Technician

## 2022-06-23 MED FILL — HUMIRA PEN CITRATE FREE 40 MG/0.4 ML: SUBCUTANEOUS | 28 days supply | Qty: 4 | Fill #4

## 2022-07-18 NOTE — Unmapped (Signed)
[name] Pharmacy Refill Coordination Note    Specialty Medication(s) to be Shipped:   Inflammatory Disorders: Humira    Other medication(s) to be shipped: No additional medications requested for fill at this time     Jason Wagner, DOB: Jun 09, 1976  Phone: [phone] (home)       All above HIPAA information was verified with patient.     Was a Nurse, learning disability used for this call? No    Completed refill call assessment today to schedule patient's medication shipment from the [facility] [phone]).  All relevant notes have been reviewed.     Specialty medication(s) and dose(s) confirmed: Regimen is correct and unchanged.   Changes to medications: Kaesyn reports no changes at this time.  Changes to insurance: No  New side effects reported not previously addressed with a pharmacist or physician: None reported  Questions for the pharmacist: No    Confirmed patient received a Conservation officer, historic buildings and a Surveyor, mining with first shipment. The patient will receive a drug information handout for each medication shipped and additional FDA Medication Guides as required.       DISEASE/MEDICATION-SPECIFIC INFORMATION        For patients on injectable medications: Patient currently has 1 doses left.  Next injection is scheduled for 07/23/22 .    SPECIALTY MEDICATION ADHERENCE     Medication Adherence    Patient reported X missed doses in the last month: 0  Specialty Medication: Humira  Patient is on additional specialty medications: No  Patient is on more than two specialty medications: No  Any gaps in refill history greater than 2 weeks in the last 3 months: no  Demonstrates understanding of importance of adherence: yes                          Were doses missed due to medication being on hold? No    Humira 40/0.4 mg/ml: 7 days of medicine on hand        REFERRAL TO PHARMACIST     Referral to the pharmacist: Not needed      [facility]     Shipping address confirmed in Epic.     Delivery Scheduled: Yes, Expected medication delivery date: 07/25/22 .     Medication will be delivered via UPS to the prescription address in Epic WAM.    [name]   [facility] Specialty Technician

## 2022-07-24 MED FILL — HUMIRA PEN CITRATE FREE 40 MG/0.4 ML: SUBCUTANEOUS | 28 days supply | Qty: 4 | Fill #5

## 2022-08-19 NOTE — Unmapped (Signed)
[facility] Specialty Pharmacy Refill Coordination Note    Specialty Medication(s) to be Shipped:   Inflammatory Disorders: Humira    Other medication(s) to be shipped: No additional medications requested for fill at this time     Jason Wagner, DOB: 1976-09-18  Phone: [phone] (home)       All above HIPAA information was verified with patient.     Was a Nurse, learning disability used for this call? No    Completed refill call assessment today to schedule patient's medication shipment from the [facility] [phone]).  All relevant notes have been reviewed.     Specialty medication(s) and dose(s) confirmed: Regimen is correct and unchanged.   Changes to medications: Jason Wagner reports no changes at this time.  Changes to insurance: No  New side effects reported not previously addressed with a pharmacist or physician: None reported  Questions for the pharmacist: No    Confirmed patient received a Conservation officer, historic buildings and a Surveyor, mining with first shipment. The patient will receive a drug information handout for each medication shipped and additional FDA Medication Guides as required.       DISEASE/MEDICATION-SPECIFIC INFORMATION        For patients on injectable medications: Patient currently has 1 doses left.  Next injection is scheduled for 08/20/22.    SPECIALTY MEDICATION ADHERENCE     Medication Adherence    Patient reported X missed doses in the last month: 0  Specialty Medication: HUMIRA(CF) PEN 40 mg/0.4 mL injection (adalimumab)  Patient is on additional specialty medications: No                                Were doses missed due to medication being on hold? No      REFERRAL TO PHARMACIST     Referral to the pharmacist: Not needed      [facility]     Shipping address confirmed in Epic.     Delivery Scheduled: Yes, Expected medication delivery date: 08/22/22.     Medication will be delivered via UPS to the prescription address in Epic WAM.    Jason Wagner   [facility] Specialty Technician

## 2022-08-21 MED FILL — HUMIRA PEN CITRATE FREE 40 MG/0.4 ML: SUBCUTANEOUS | 28 days supply | Qty: 4 | Fill #6

## 2022-09-23 NOTE — Unmapped (Signed)
[facility] Specialty Pharmacy Refill Coordination Note    Specialty Medication(s) to be Shipped:   Inflammatory Disorders: Humira    Other medication(s) to be shipped: No additional medications requested for fill at this time     Jason Wagner, DOB: 01-19-1976  Phone: [phone] (home)       All above HIPAA information was verified with patient.     Was a Nurse, learning disability used for this call? No    Completed refill call assessment today to schedule patient's medication shipment from the [facility] [phone]).  All relevant notes have been reviewed.     Specialty medication(s) and dose(s) confirmed: Regimen is correct and unchanged.   Changes to medications: Jason Wagner reports no changes at this time.  Changes to insurance: No  New side effects reported not previously addressed with a pharmacist or physician: None reported  Questions for the pharmacist: No    Confirmed patient received a Conservation officer, historic buildings and a Surveyor, mining with first shipment. The patient will receive a drug information handout for each medication shipped and additional FDA Medication Guides as required.       DISEASE/MEDICATION-SPECIFIC INFORMATION        For patients on injectable medications: Patient currently has 1 doses left.  Next injection is scheduled for 09/24/22 .    SPECIALTY MEDICATION ADHERENCE     Medication Adherence    Patient reported X missed doses in the last month: 0  Specialty Medication: HUMIRA(CF) PEN 40 mg/0.4 mL  Patient is on additional specialty medications: No  Patient is on more than two specialty medications: No  Any gaps in refill history greater than 2 weeks in the last 3 months: no  Demonstrates understanding of importance of adherence: yes                          Were doses missed due to medication being on hold? No    Humira 40/0.4 mg/ml: 7 days of medicine on hand        REFERRAL TO PHARMACIST     Referral to the pharmacist: Not needed      [facility]     Shipping address confirmed in Epic.     Delivery Scheduled: Yes, Expected medication delivery date: 09/26/22 .     Medication will be delivered via UPS to the prescription address in Epic WAM.    Jason Wagner   [facility] Specialty Technician

## 2022-09-25 MED FILL — HUMIRA PEN CITRATE FREE 40 MG/0.4 ML: SUBCUTANEOUS | 28 days supply | Qty: 4 | Fill #7

## 2022-10-01 DIAGNOSIS — M459 Ankylosing spondylitis of unspecified sites in spine: Principal | ICD-10-CM

## 2022-10-01 MED ORDER — IBUPROFEN 800 MG TABLET
ORAL_TABLET | Freq: Two times a day (BID) | ORAL | 0 refills | 90 days | Status: CP | PRN
Start: 2022-10-01 — End: 2023-10-01

## 2022-10-01 NOTE — Unmapped (Signed)
Ibuprofen refill  Last Visit Date: 05/21/2022  Next Visit Date: 10/30/2022

## 2022-10-01 NOTE — Unmapped (Signed)
Reason for call: pt need refill for Ibuprofen 800mg - [facility] in [place].Thanks     Last ov: 05/21/2022  Next ov: 10/30/2022

## 2022-10-01 NOTE — Unmapped (Signed)
Addended by: Arnette Felts on: 10/01/2022 02:45 PM     Modules accepted: Orders

## 2022-10-02 NOTE — Unmapped (Signed)
Addended by: [name] E on: 10/01/2022 04:19 PM     Modules accepted: Orders

## 2022-10-21 NOTE — Unmapped (Signed)
[facility] Refill Coordination Note    Specialty Medication(s) to be Shipped:   Inflammatory Disorders: Humira    Other medication(s) to be shipped: No additional medications requested for fill at this time     Jason Wagner, DOB: [birth year]  Phone: [phone] (home)       All above HIPAA information was verified with patient.     Was a Nurse, learning disability used for this call? No    Completed refill call assessment today to schedule patient's medication shipment from the [facility] [phone]).  All relevant notes have been reviewed.     Specialty medication(s) and dose(s) confirmed: Regimen is correct and unchanged.   Changes to medications: Jason Wagner reports no changes at this time.  Changes to insurance: No  New side effects reported not previously addressed with a pharmacist or physician: None reported  Questions for the pharmacist: No    Confirmed patient received a Conservation officer, historic buildings and a Surveyor, mining with first shipment. The patient will receive a drug information handout for each medication shipped and additional FDA Medication Guides as required.       DISEASE/MEDICATION-SPECIFIC INFORMATION        For patients on injectable medications: Patient currently has 1 doses left.  Next injection is scheduled for 10/22/22.    SPECIALTY MEDICATION ADHERENCE     Medication Adherence    Patient reported X missed doses in the last month: 0  Specialty Medication: Humira 40mg /0.88ml  Patient is on additional specialty medications: No  Informant: patient                                Were doses missed due to medication being on hold? No    Humira 40  mg/0.64ml : 7 days of medicine on hand       REFERRAL TO PHARMACIST     Referral to the pharmacist: Not needed      [facility]     Shipping address confirmed in Epic.     Delivery Scheduled: Yes, Expected medication delivery date: 10/23/22.     Medication will be delivered via UPS to the prescription address in Epic WAM.    Jasper Loser   [facility] Specialty Technician

## 2022-10-22 MED FILL — HUMIRA PEN CITRATE FREE 40 MG/0.4 ML: SUBCUTANEOUS | 28 days supply | Qty: 4 | Fill #8

## 2022-10-30 ENCOUNTER — Ambulatory Visit: Admit: 2022-10-30 | Discharge: 2022-10-30 | Payer: PRIVATE HEALTH INSURANCE

## 2022-10-30 DIAGNOSIS — M459 Ankylosing spondylitis of unspecified sites in spine: Principal | ICD-10-CM

## 2022-10-30 LAB — CBC W/ AUTO DIFF
BASOPHILS ABSOLUTE COUNT: 0.1 10*9/L (ref 0.0–0.1)
BASOPHILS RELATIVE PERCENT: 0.9 %
EOSINOPHILS ABSOLUTE COUNT: 0.1 10*9/L (ref 0.0–0.5)
EOSINOPHILS RELATIVE PERCENT: 0.8 %
HEMATOCRIT: 43.4 % (ref 39.0–48.0)
HEMOGLOBIN: 15.1 g/dL (ref 12.9–16.5)
LYMPHOCYTES ABSOLUTE COUNT: 3 10*9/L (ref 1.1–3.6)
LYMPHOCYTES RELATIVE PERCENT: 28.5 %
MEAN CORPUSCULAR HEMOGLOBIN CONC: 34.7 g/dL (ref 32.0–36.0)
MEAN CORPUSCULAR HEMOGLOBIN: 30.5 pg (ref 25.9–32.4)
MEAN CORPUSCULAR VOLUME: 87.9 fL (ref 77.6–95.7)
MEAN PLATELET VOLUME: 7.1 fL (ref 6.8–10.7)
MONOCYTES ABSOLUTE COUNT: 0.8 10*9/L (ref 0.3–0.8)
MONOCYTES RELATIVE PERCENT: 8 %
NEUTROPHILS ABSOLUTE COUNT: 6.5 10*9/L (ref 1.8–7.8)
NEUTROPHILS RELATIVE PERCENT: 61.8 %
NUCLEATED RED BLOOD CELLS: 0 /100{WBCs} (ref <=4)
PLATELET COUNT: 190 10*9/L (ref 150–450)
RED BLOOD CELL COUNT: 4.94 10*12/L (ref 4.26–5.60)
RED CELL DISTRIBUTION WIDTH: 13.3 % (ref 12.2–15.2)
WBC ADJUSTED: 10.4 10*9/L (ref 3.6–11.2)

## 2022-10-30 LAB — CREATININE
CREATININE: 0.79 mg/dL
EGFR CKD-EPI (2021) MALE: >90 mL/min/{1.73_m2} (ref >=60)

## 2022-10-30 LAB — ALT: ALT (SGPT): 16 U/L (ref 10–49)

## 2022-10-30 LAB — BUN: BLOOD UREA NITROGEN: 15 mg/dL (ref 9–23)

## 2022-10-30 LAB — AST: AST (SGOT): 19 U/L (ref <=34)

## 2022-10-30 LAB — ALBUMIN: ALBUMIN: 4.2 g/dL (ref 3.4–5.0)

## 2022-10-30 NOTE — Unmapped (Unsigned)
[facility] Rheumatology Clinic Visit    ASSESSMENT/PLAN:    47 y.o. male smoker with prior history of chronic joint pain presenting for follow-up for newly diagnosed ankylosing spondylitis.      Last seen 04/2022     Currently on Humira 40 mg subcutaneous weekly (started 06/2021; increased to weekly 01/2022). Today he reports overall improvement but continues to have low back pain and stiffness. Some of his back pain sounds mechanical in nature though. Reassuringly occiput to wall and Schober are stable on exam today. Discussed taking NSAID more regularly to help with pain. Will update imaging today to monitor for progression.     Ankylosing Spondylitis:   - Update monitoring labs: CBCw/diff, Crt, AST, ALT, ESR, CRP  - Continue Humira 40mg  Meriwether weekly.   - Update hands, SI and Lumber spine xray today to monitor for disease progression. If progression will consider switching agent. 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   - Ibuprofen 800mg  BID PRN. Instructed to take with food. Pt unable to tolerated indomethacin or meloxicam previously.     Tobacco use - currently smoking 1/2pk/dy; congratulated pt on decrease but advised cessation.       _______________________________________________________________________________    REASON FOR VISIT: F/U AS      HPI: Jason Wagner is a 47 y.o. male with prior history of chronic joint pain presenting for follow-up for  ankylosing spondylitis.  Clinical course has been complicated by intermittent follow-up and non-adherence to recommended therapies.      Last seen 04/2022 with GraceSmoak    Currently on Humira 40 mg subcutaneous weekly     Interim History:     He says that he finds     He says he had a bout of conjunctivitis.  He says he had       Rheumatologic History:  Established care in 10/2019  at which time patient endorsed several year history of back pain in stiffness with recent pain is better. He is having less stiffness at night and in the AM but still feels restricted.  He has been having muscle spasms in his neck, which are bothersome to him.  He did have an eposide of sudden onset right knee pain. He saw an orthopedist outside of the [facility] system. He says they did an xray and told him he had inflammation. He received steroid injection and sx resolved.     He says he had a bout of conjunctivitis.  He says he had it in one eye and it spread to the other. He says that his eye doctor told him it was not uveitis.     He continues to smoke. He is trying to cut back.     Rheumatologic History:  Established care in 10/2019  at which time patient endorsed several year history of back pain in stiffness with recent worsening of symptoms in upper thoracic and cervical spine.  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 Noted to have severely limited Schober's and Occiput to Wall at that time. Lab work notable for mildly elevated ESR and CRP.  HLAB27 +.   ID Screen negative Quant gold, HIV, HCV and HBV serologies. Imaging notable for fusion of b/l sacroiliac joints, L1-L4 facet ankylosis. 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  Based on his history and presentation, high suspicion for ankylosing spondylitis and recommended trial of scheduled  NSAID (meloxicam) but remains symptomatic.   Recommended patient start on anti-TNF (Humira) in 12/2019 but this was not started due to prolonged dental infection.  He ultimately started in 06/2021. In 01/2022 he was recommend to increase his Humira to weekly dosing.     Record Review: Available records were reviewed, including pertinent office visits, labs, and imaging.      REVIEW OF SYSTEMS: Ten system were reviewed and negative except as noted above.    Past Medical History:   Diagnosis Date    Arthritis     Waiting on test results from Rheumotologist appointment 2/17    Brain Socioeconomic History    Marital status: Unknown   Tobacco Use    Smoking status: Every Day     Current packs/day: 1.00     Average packs/day: 1 pack/day for 35.4 years (35.4 ttl pk-yrs)     Types: Cigarettes     Start date: 06/23/1987    Smokeless tobacco: Never   Substance and Sexual Activity    Alcohol use: Yes     Comment: 3-4 times a year    Drug use: Never    Sexual activity: Yes     Partners: Female     Comment: Hysterectomy   Other Topics Concern    Exercise Yes     Comment: Work    Living Situation Yes     Comment: Love with my family/ wife and 2/6 children     Family History   Problem Relation Age of Onset    Cancer Maternal Grandfather         Brought on by diabetes    Diabetes Maternal Grandfather         State 2 diabetes insulin dependent    Cancer Mother         Lung and breast cancer    Cancer Maternal Grandmother         Leukemia         Objective   There were no vitals filed for this visit.        Physical Exam:  General:   Patient is well appearing, and does not appear to be in any acute distress   Eyes:   PERRLA, EOMI, and sclera anicteric.    Cardiovascular:  normal rate, regular rhythm.  S1 and S2 normal, without any murmur, rub, or gallop.   Pulmonary:  Clear to auscultation bilaterally, without                           Wheezes/crackles/rhonchi.  Good air movement. Normal work of breathing.   Skin:    No rash/lesions/breakdown. Normal turgor and elasticity.    No Raynaud???s phenomenon or digital ulcers.  No purpura, petechiae, telangiectasia or livedo reticularis.   Psychiatry:   Alert and oriented to person, place, and time. Mood and affect appropriate and congruent.   Extremities:   Warm and well perfused. No cyanosis, clubbing    Musculo Skeletal:   Tenderness in neck/shoulders. Limited range of motion in neck, but otherwise full range of motion in elbow, hip, knee, ankle, hands and feet. Tender to b/l PIPs w/o swelling. B/L shoulders w/ dec ROM over head. No evidence of active synovitis in the small joints of the hands or feet. Tender to midline low back.    Neurological:  Alert and oriented to person, place and time.  Cranial nerves II-XII grossly intact.  Strength good bilaterally     05/21/22  Occiput to wall: 9.5cm  Modified schober's: 15cm --> 17cm    Testing Results from 08/29/2021   Occiput to Wall:  10.5 cm   Chest expansion:  4.5 cm   Schober's:  15 cm --> 17 cm     Labs/Imaging/Other:  Lab Results   Component Value Date    WBC 10.5 05/21/2022    HGB 14.6 05/21/2022    HCT 41.9 05/21/2022    PLT 206 05/21/2022       Lab Results   Component Value Date    NA 141 08/29/2021    K 4.1 08/29/2021    CL 105 08/29/2021    CO2 27.7 08/29/2021    BUN 15 08/29/2021    CREATININE 0.90 05/21/2022    GLU 85 08/29/2021    CALCIUM 9.6 08/29/2021       Lab Results   Component Value Date    BILITOT 0.6 08/29/2021    PROT 7.2 08/29/2021    ALBUMIN 4.7 08/29/2021    ALT 17 05/21/2022    AST 14 05/21/2022    ALKPHOS 78 08/29/2021       No results found for: PT, INR, APTT

## 2022-10-30 NOTE — Unmapped (Signed)
You were seen by [facility] Rheumatology today.      [facility]  [address], [address]   [place], [place] [address]  Phone:  [phone]  Fax:  [phone]     Rheumatologist:  Dr. Ilsa Iha       Summary of Plan for 10/30/22    Diagnostic testing recommendations:  Labs today (in clinic or on the first floor of the [employer])    Therapeutic / Treatment recommendations:   No change in medications today. Please continue your current treatment regimen. Please notify our clinic if you need refills prior to your next appointment    Referrals and follow-up recommendations:  Please follow-up with PCP  to discuss smoking cessation   I have referred you to [facility] to help you with your mechanical back pain     When should I expect results?   Please note that it may take up to 21 days for results to return.  If you have not been notified by phone, mail or [facility] (if applicable) in 21 days, please contact our office at [phone] or via [facility] [employer] messaging [email or web address])     What do I do if I have questions or concerns after the visit?   If you have non-urgent questions, the best way to contact your provider is to send a MyChart message by visiting [email or web address].  You can also use MyChart to request refills and access test results.  Providers will do their best to respond within 2-3 business days, although it may take longer in some circumstances.  If you have immediate concerns, please contact our clinic by phone  at [phone].     Thank you for allowing [facility] to be involved in your care!

## 2022-12-15 NOTE — Unmapped (Signed)
[facility] Refill Coordination Note    Specialty Medication(s) to be Shipped:   Inflammatory Disorders: Humira    Other medication(s) to be shipped: No additional medications requested for fill at this time     Jason Wagner, DOB: Aug 25, 1976  Phone: [phone] (home)       All above HIPAA information was verified with patient.     Was a Nurse, learning disability used for this call? No    Completed refill call assessment today to schedule patient's medication shipment from the [facility] [phone]).  All relevant notes have been reviewed.     Specialty medication(s) and dose(s) confirmed: Regimen is correct and unchanged.   Changes to medications: June reports no changes at this time.  Changes to insurance: No  New side effects reported not previously addressed with a pharmacist or physician: None reported  Questions for the pharmacist: No    Confirmed patient received a Conservation officer, historic buildings and a Surveyor, mining with first shipment. The patient will receive a drug information handout for each medication shipped and additional FDA Medication Guides as required.       DISEASE/MEDICATION-SPECIFIC INFORMATION        For patients on injectable medications: Patient currently has 0 doses left.  Next injection is scheduled for ASAP.    SPECIALTY MEDICATION ADHERENCE     Medication Adherence    Patient reported X missed doses in the last month: 1  Specialty Medication: HUMIRA(CF) PEN 40 mg/0.4 mL i  Informant: patient  Confirmed plan for next specialty medication refill: delivery by pharmacy  Refills needed for supportive medications: not needed              Were doses missed due to medication being on hold? No    Humira 40/0.4 mg/ml: 0 days of medicine on hand       REFERRAL TO PHARMACIST     Referral to the pharmacist: Not needed      [facility]     Shipping address confirmed in Epic.     Patient was notified of new phone menu : Yes    Delivery Scheduled: Yes, Expected medication delivery date: 12/17/22.     Medication will be delivered via UPS to the prescription address in Epic WAM.    Kathe Wirick Vangie Bicker, PharmD   [facility] Specialty Pharmacist

## 2022-12-16 MED FILL — HUMIRA PEN CITRATE FREE 40 MG/0.4 ML: SUBCUTANEOUS | 28 days supply | Qty: 4 | Fill #9

## 2022-12-29 NOTE — Unmapped (Signed)
Returned pt's phone call. He just found out he has a small hernia to belly button. He is likely going to have surgery for this but doesn't have a date yet for this. He is inquiring if there is anything he needs to know for surgery with his disease. Informed him he needs to hold Humira two weeks prior to surgery and then can restart two weeks post-op if  healing well without complications or infections. Pt verbalized understanding and appreciated call.     [name], FNP

## 2022-12-29 NOTE — Unmapped (Signed)
Grace pls call pt Jason Wagner belly button, states he went to doctor today and they want to do surgery, he would like your input.

## 2023-01-07 DIAGNOSIS — M459 Ankylosing spondylitis of unspecified sites in spine: Principal | ICD-10-CM

## 2023-01-10 NOTE — Unmapped (Unsigned)
[facility]  Physical Medicine and Rehabilitation     Patient Name:Jason Wagner  MRN: [id number]  DOB: [birth year]  Age: 47 y.o.     ----------------------------------------------------------------------------------------------------------------------  January 10, 2023 1:46 PM. Documentation assistance provided by Collins Scotland, medical scribe, at the direction of Valeda Malm, MD.  ----------------------------------------------------------------------------------------------------------------------    ASSESSMENT & PLAN:     01/10/23     DIAGNOSIS:   Ankylosing Spondylitis    TREATMENT PLAN: ***    NEXT STEPS/FOLLOW UP: ***      Comprehensive patient education performed today explaining that the care plan will integrate multimodal activity-based rehabilitation techniques to enhance recovery, reduce pain, and facilitate functionality. Risks, benefits, and instructions for all medications prescribed / treatments offered  reviewed extensively with patient, who expresses understanding.  Patient strongly advised regarding red flag signs such as new or progressive motor weakness, sensory deficits, saddle anesthesia, bowel/bladder dysfunction, gait/coordination disturbance, weight loss, and night pain that should prompt evaluation at nearest ER.    A consultation report has been transmitted to the consult requesting physician.    SUBJECTIVE:     Chief Complaint:    No chief complaint on file.    History of Present Illness:   Jason Wagner is a 47 y.o. year old male being evaluated in consultation at the request of Ilsa Iha Lonn Georgia, MD for back pain. I have reviewed to referring provider's note.  47 yo man with ankylosing spondylitis on Humira weekly and NSAID with improvement in inflammatory joint sx.  Now with ongoing mechanical pain related to the restriction in spine. Please evaluate and treat for strengthing and rehabilitation given his limitation in ROM.  Reports overall improvement but continues to have c-spine and l-spine pain consistent with mechanical etiology. Referred to PM&R to assist with management of non-inflammatory pain symptoms. He has been having muscle spasms in his neck.     Symptom Location: c-spine and l-spine  Symptom Character: stiffness  Symptom Onset/Mechanism: chronic (>/= 3 months)  Night Pain: no  Unintended Weight Loss: no  Fever/Infection:no  Neuromotor Function: motor weakness - (no),  gait/coordination disturbance - (no), loss of bowel or bladder control - (no), saddle anesthesia - (no)     Prior Interventions/Modalities:  ***    Meds:  - Humira 40mg  weekly  - Ibuprofen 800mg  BID PRN    Home Exercise Program:  ***    Psychosocial:  Work:    Prior Diagnostics:  XR Lumbar Spine (05/21/22)  Impression:  Findings consistent with ankylosing spondylitis.    XR Sacroiliac Joints (05/21/22)  Impression:  Ankylosis of the bilateral SI joints, consistent with known ankylosing spondylitis.    MRI Cervical Spine Wo Contrast (08/25/19)    XR Cervical Spine AP Lateral and Flexion and Extension (08/10/19)  Impression:  Early degenerative endplate changes of the mid to lower cervical spine with preserved disc spaces.    Current Outpatient Medications   Medication Sig Dispense Refill    acetaminophen (TYLENOL) 500 MG tablet Take 1 tablet (500 mg total) by mouth every six (6) hours as needed for pain.      empty container (SHARPS-A-GATOR DISPOSAL SYSTEM) Misc Use as directed for sharps disposal 1 each 2    empty container Misc Use as directed 1 each 2    HUMIRA PEN CITRATE FREE 40 MG/0.4 ML Inject the contents of 1 pen (40 mg total) under the skin every seven (7) days. 12 each 3    ibuprofen (MOTRIN) 800 MG tablet Take 1 tablet (  800 mg total) by mouth two (2) times a day as needed for pain. 180 tablet 0     No current facility-administered medications for this visit.       Allergies:   Patient has no known allergies.    Past Medical / Surgical History:     Past Medical History:   Diagnosis Date Arthritis     Waiting on test results from Rheumotologist appointment 2/17    Brain concussion 1987    First of many    Headache 2019-2020    Starting getting them after my neck got really bad.       No past surgical history on file.    Social History     Socioeconomic History    Marital status: Unknown   Tobacco Use    Smoking status: Every Day     Current packs/day: 1.00     Average packs/day: 1 pack/day for 35.6 years (35.6 ttl pk-yrs)     Types: Cigarettes     Start date: 06/23/1987    Smokeless tobacco: Never   Substance and Sexual Activity    Alcohol use: Yes     Comment: 3-4 times a year    Drug use: Never    Sexual activity: Yes     Partners: Female     Comment: Hysterectomy   Other Topics Concern    Exercise Yes     Comment: Work    Living Situation Yes     Comment: Love with my family/ wife and 2/6 children       Family History   Problem Relation Age of Onset    Cancer Maternal Grandfather         Brought on by diabetes    Diabetes Maternal Grandfather         State 2 diabetes insulin dependent    Cancer Mother         Lung and breast cancer    Cancer Maternal Grandmother         Leukemia       For any of the above entries which indicate no records on file, the patient reports no relevant history.                 Review of Systems:   Review of Systems was completed through a 10 organ system review and is listed in the chart.  Pertinent positives are noted in HPI or flowsheet and otherwise negative.  Patient has been instructed to followup with PCP or appropriate specialist for symptoms outside the purview of this speciality.    OBJECTIVE:     Vitals:     There were no vitals taken for this visit.    The above medications, allergies, history, and ROS, and vitals have been reviewed.    Physical Exam:   GEN: alert and oriented, no apparent distress  HEENT: normocephalic, atraumatic, anicteric, moist mucous membranes  CV: normal heart rate  PULM: normal work of breathing  GI: nondistended  EXT: no swelling, edema in b/l UE and LE  SKIN: no visible ecchymosis or breakdown  PSYCH: normal mood and affect    NEURO:   Manual Muscle Testing    Left Upper Extremity Right Upper Extremity   Shoulder Abduction 5/5 5/5   Elbow Flexion 5/5 5/5   Elbow Extension  5/5 5/5   Wrist Extension  5/5 5/5   Finger Abduction  5/5 5/5           Left Lower Extremity  Right Lower Extremity   Hip Flexion  5/5 5/5   Knee Extension  5/5 5/5   Ankle Dorsiflexion  5/5 5/5   Toe Flexion 5/5 5/5   EHL Extension  5/5 5/5      Reflexes     Left Upper Extremity Right Upper Extremity    Triceps 2+ 2+   Biceps 2+ 2+   Brachioradialis 2+ 2+   Hoffman's  negative negative        Left Lower Extremity Right Lower Extremity    Patellar 2+ 2+   Achilles  2+ 2+   Ankle Clonus negative negative   Babinski negative negative     Sensory  Sensation to light touch WNL throughout b/l UE and LE    MSK:  Gait and Station: normal nonantalgic gait with symmetric body posture    Cervical Spine:   Inspection: Normal alignment. No erythema, discoloration, or asymmetry.  Palpation: No paraspinal muscle tendereness.  No vertebral body point tenderness.  ROM: normal flexion, extension, rotation, lateral bend  Spurling's: - (negative) LUE, - (negative) RUE    Lumbar Spine:  Inspection: Normal alignment. No erythema, discoloration, or asymmetry.  Palpation: No paraspinal muscle tenderness.  No vertebral body point tenderness.  ROM: normal flexion, extension, rotation, lateral bend   Facet Loading Pain: - (negative) left, - (negative) right    Sacroiliac Joint:   Inspection: Normal alignment. No erythema, discoloration, or asymmetry.  Palpation: No tenderness at PSIS  Patricks: - (negative) LLE, - (negative) RLE  Active assisted straight leg raise: - (negative) LLE, - (negative) RLE    Hip:  Inspection: Normal alignment. No erythema, discoloration, or asymmetry.  Palpation: No tenderness to palpation.  ROM: normal flexion, extension, internal/external rotation  FABER: - (negative) LLE, - (negative) RLE  IR/Scour: - (negative) LLE, - (negative) RLE    Piriformis:  Tenderness to palpation:  Pain with activation:  Pain with stretch:    Shoulder:  Inspection: Normal alignment. No erythema, discoloration, or asymmetry.  No atrophy.  Palpation: No tenderness to palpation.  ROM: normal flexion, extension, internal rotation, external rotation  Neer's: - (negative) LUE, - (negative) RUE  Hawkin's: - (negative) LUE, - (negative) RUE  Empty Can: - (negative) LUE, - (negative) RUE  Speed's:  - (negative) LUE, - (negative) RUE  Cross Arm Adduction: - (negative) LUE, - (negative) RUE    ----------------------------------------------------------------------------------------------------------------------  January 10, 2023 1:46 PM. Documentation assistance provided by Collins Scotland, medical scribe, at the direction of Valeda Malm, MD.  ----------------------------------------------------------------------------------------------------------------------      Tia Masker, MD  Assistant Professor - PM&R  Musculoskeletal and Spine Specialist - [facility] [employer]

## 2023-01-14 NOTE — Unmapped (Signed)
[facility] Refill Coordination Note    Specialty Medication(s) to be Shipped:   Inflammatory Disorders: Humira    Other medication(s) to be shipped: No additional medications requested for fill at this time     Jason Wagner, DOB: 07/06/1976  Phone: [phone] (home)       All above HIPAA information was verified with patient.     Was a Nurse, learning disability used for this call? No    Completed refill call assessment today to schedule patient's medication shipment from the [facility] [phone]).  All relevant notes have been reviewed.     Specialty medication(s) and dose(s) confirmed: Regimen is correct and unchanged.   Changes to medications: Jason Wagner reports no changes at this time.  Changes to insurance: No  New side effects reported not previously addressed with a pharmacist or physician: None reported  Questions for the pharmacist: No    Confirmed patient received a Conservation officer, historic buildings and a Surveyor, mining with first shipment. The patient will receive a drug information handout for each medication shipped and additional FDA Medication Guides as required.       DISEASE/MEDICATION-SPECIFIC INFORMATION        For patients on injectable medications: Patient currently has 01 doses left.  Next injection is scheduled for 4/25.    SPECIALTY MEDICATION ADHERENCE     Medication Adherence    Specialty Medication: HUMIRA(CF) PEN 40 mg/0.4 mL injection (adalimumab)              Were doses missed due to medication being on hold? No    Humira 40/0.4 mg/ml: 7 days of medicine on hand       REFERRAL TO PHARMACIST     Referral to the pharmacist: Not needed      [facility]     Shipping address confirmed in Epic.     Patient was notified of new phone menu : Yes    Delivery Scheduled: Yes, Expected medication delivery date: 01/16/23.     Medication will be delivered via UPS to the prescription address in Epic WAM.    Gaspar Cola Shared [facility] Specialty Pharmacist

## 2023-01-15 MED FILL — HUMIRA PEN CITRATE FREE 40 MG/0.4 ML: SUBCUTANEOUS | 28 days supply | Qty: 4 | Fill #10

## 2023-02-11 DIAGNOSIS — M459 Ankylosing spondylitis of unspecified sites in spine: Principal | ICD-10-CM

## 2023-02-11 MED ORDER — HUMIRA PEN CITRATE FREE 40 MG/0.4 ML
SUBCUTANEOUS | 3 refills | 84 days | Status: CP
Start: 2023-02-11 — End: 2024-02-11
  Filled 2023-02-17: qty 4, 28d supply, fill #0

## 2023-02-11 NOTE — Unmapped (Signed)
Humira refill  Last Visit Date: 10/30/2022  Next Visit Date: 03/31/2023    Lab Results   Component Value Date    ALT 16 10/30/2022    AST 19 10/30/2022    ALBUMIN 4.2 10/30/2022    CREATININE 0.79 10/30/2022     Lab Results   Component Value Date    WBC 10.4 10/30/2022    HGB 15.1 10/30/2022    HCT 43.4 10/30/2022    PLT 190 10/30/2022     Lab Results   Component Value Date    NEUTROPCT 61.8 10/30/2022    LYMPHOPCT 28.5 10/30/2022    MONOPCT 8.0 10/30/2022    EOSPCT 0.8 10/30/2022    BASOPCT 0.9 10/30/2022

## 2023-02-11 NOTE — Unmapped (Signed)
[facility] Refill Coordination Note    Specialty Medication(s) to be Shipped:   Inflammatory Disorders: Humira    Other medication(s) to be shipped: No additional medications requested for fill at this time     Jason Wagner, DOB: 1976/02/09  Phone: [phone] (home)       All above HIPAA information was verified with patient.     Was a Nurse, learning disability used for this call? No    Completed refill call assessment today to schedule patient's medication shipment from the [facility] [phone]).  All relevant notes have been reviewed.     Specialty medication(s) and dose(s) confirmed: Regimen is correct and unchanged.   Changes to medications: Jason Wagner no changes at this time.  Changes to insurance: No  New side effects reported not previously addressed with a pharmacist or physician: None reported  Questions for the pharmacist: No    Confirmed patient received a Conservation officer, historic buildings and a Surveyor, mining with first shipment. The patient will receive a drug information handout for each medication shipped and additional FDA Medication Guides as required.       DISEASE/MEDICATION-SPECIFIC INFORMATION        For patients on injectable medications: Patient currently has 1 doses left.  Next injection is scheduled for 02/11/23.    SPECIALTY MEDICATION ADHERENCE     Medication Adherence    Patient reported X missed doses in the last month: 0  Specialty Medication: HUMIRA(CF) PEN 40 mg/0.4 mL injection (adalimumab)  Patient is on additional specialty medications: No  Patient is on more than two specialty medications: No  Any gaps in refill history greater than 2 weeks in the last 3 months: no  Demonstrates understanding of importance of adherence: yes  Informant: patient  Reliability of informant: reliable  Provider-estimated medication adherence level: good  Patient is at risk for Non-Adherence: No  Reasons for non-adherence: no problems identified              Were doses missed due to medication being on hold? No    HUMIRA(CF) PEN 40 mg/0.4 mL injection (adalimumab)  : 0 days of medicine on hand       REFERRAL TO PHARMACIST     Referral to the pharmacist: Not needed      [facility]     Shipping address confirmed in Epic.       Delivery Scheduled: Yes, Expected medication delivery date: 02/18/23.  However, Rx request for refills was sent to the provider as there are none remaining.     Medication will be delivered via UPS to the prescription address in Epic WAM.    [name]' W Danae Chen Shared [facility] Specialty Technician

## 2023-03-12 NOTE — Unmapped (Signed)
[facility] Shared Jason Wagner Clinical Assessment & Refill Coordination Note    Taden Witter, [place]: 1976-02-13  Phone: [phone] (home)     All above HIPAA information was verified with patient.     Was a Nurse, learning disability used for this call? No    Specialty Medication(s):   Inflammatory Disorders: Humira     Current Outpatient Medications   Medication Sig Dispense Refill    acetaminophen (TYLENOL) 500 MG tablet Take 1 tablet (500 mg total) by mouth every six (6) hours as needed for pain.      empty container (SHARPS-A-GATOR DISPOSAL SYSTEM) Misc Use as directed for sharps disposal 1 each 2    empty container Misc Use as directed 1 each 2    HUMIRA PEN CITRATE FREE 40 MG/0.4 ML Inject the contents of 1 pen (40 mg total) under the skin every seven (7) days. 12 each 3    ibuprofen (MOTRIN) 800 MG tablet Take 1 tablet (800 mg total) by mouth two (2) times a day as needed for pain. 180 tablet 0     No current facility-administered medications for this visit.        Changes to medications: Demaryius reports no changes at this time.    No Known Allergies    Changes to allergies: No    SPECIALTY MEDICATION ADHERENCE       Medication Adherence    Patient reported X missed doses in the last month: 0  Specialty Medication: Humira q7d  Patient is on additional specialty medications: No  Informant: patient          Specialty medication(s) dose(s) confirmed: Regimen is correct and unchanged.     Are there any concerns with adherence? No    Adherence counseling provided? Not needed    CLINICAL MANAGEMENT AND INTERVENTION      Clinical Benefit Assessment:    Do you feel the medicine is effective or helping your condition? Yes--helping some but not all gone away    Clinical Benefit counseling provided?  Seen in 11/19/22    Adverse Effects Assessment:    Are you experiencing any side effects? No    Are you experiencing difficulty administering your medicine? No    Quality of Life Assessment:    Quality of Life Rheumatology  1. What impact has your specialty medication had on the reduction of your daily pain level?: Some  2. What impact has your specialty medication had on your ability to complete daily tasks (prepare meals, get dressed, etc...)?: Some  Oncology  Dermatology  Cystic Fibrosis          How many days over the past month did your condition  keep you from your normal activities? For example, brushing your teeth or getting up in the morning.      Have you discussed this with your provider? Not needed    Acute Infection Status:    Acute infections noted within Epic:  No active infections  Patient reported infection: None    Therapy Appropriateness:    Is therapy appropriate and patient progressing towards therapeutic goals? Yes, therapy is appropriate and should be continued    DISEASE/MEDICATION-SPECIFIC INFORMATION      For patients on injectable medications: Patient currently has 1 doses left.  Next injection is scheduled for 6/20.    Chronic Inflammatory Diseases: Have you experienced any flares in the last month? Yes, has had it  Has this been reported to your provider? Yes, he has spoken with provider  PATIENT SPECIFIC NEEDS     Does the patient have any physical, cognitive, or cultural barriers? No    Is the patient high risk? No    Did the patient require a clinical intervention? No    Does the patient require physician intervention or other additional services (i.e., nutrition, smoking cessation, social work)? No    SOCIAL DETERMINANTS OF HEALTH     At the [facility], we have learned that life circumstances - like trouble affording food, housing, utilities, or transportation can affect the health of many of our patients.   That is why we wanted to ask: are you currently experiencing any life circumstances that are negatively impacting your health and/or quality of life? Patient declined to answer    Social Determinants of Health     Financial Resource Strain: Not on file   Internet Connectivity: Not on file   Food Insecurity: Not on file   Tobacco Use: High Risk (10/30/2022)    Patient History     Smoking Tobacco Use: Every Day     Smokeless Tobacco Use: Never     Passive Exposure: Not on file   Housing/Utilities: Not on file   Alcohol Use: Not on file   Transportation Needs: Not on file   Substance Use: Not on file   Health Literacy: Not on file   Physical Activity: Not on file   Interpersonal Safety: Not on file   Stress: Not on file   Intimate Partner Violence: Not on file   Depression: Not on file   Social Connections: Not on file       Would you be willing to receive help with any of the needs that you have identified today? No       SHIPPING     Specialty Medication(s) to be Shipped:   Inflammatory Disorders: Humira    Other medication(s) to be shipped: No additional medications requested for fill at this time     Changes to insurance: No    Delivery Scheduled: Yes, Expected medication delivery date: 6/26.     Medication will be delivered via UPS to the confirmed prescription address in [facility].    The patient will receive a drug information handout for each medication shipped and additional FDA Medication Guides as required.  Verified that patient has previously received a Conservation officer, historic buildings and a Surveyor, mining.    The patient or caregiver noted above participated in the development of this care plan and knows that they can request review of or adjustments to the care plan at any time.      All of the patient's questions and concerns have been addressed.    Julianne Rice, PharmD   [facility] Specialty Pharmacist

## 2023-03-17 MED FILL — HUMIRA PEN CITRATE FREE 40 MG/0.4 ML: SUBCUTANEOUS | 28 days supply | Qty: 4 | Fill #1

## 2023-03-30 NOTE — Unmapped (Unsigned)
[facility] Visit    ASSESSMENT/PLAN:    47 y.o. male smoker with prior history of chronic joint pain presenting for follow-up for ankylosing spondylitis.      Last seen 10/30/2022 with Dr. Ilsa Iha    ***    Currently on Humira 40 mg subcutaneous weekly (started 06/2021; increased to weekly 01/2022). Today he reports overall improvement but continues to have c-spine and l-spine pain consistent with mechanical etiology. Reassuringly occiput to wall and Schober's stable with stability in updated imaging.  Recommend referral to PM&R to assist with management of non-inflammatory pain symptoms. He would benefit from PT as well.     Ankylosing Spondylitis:   - Update monitoring labs: CBCw/diff, Crt, AST, ALT, ESR, CRP  - Continue Humira 40mg  [address] weekly.   - Ibuprofen 800mg  BID PRN. Instructed to take with food. Pt unable to tolerated indomethacin or meloxicam previously.     Tobacco use - currently smoking 1/2pk/dy; congratulated pt on decrease but advised cessation.     Health Maintenance  - Recommend yearly flu, COVID19 and PCV 20 vaccines and discussed rationale behind my recommendations; patient declines     F/U as scheduled in January with Dr. Ilsa Iha    I personally spent *** minutes face-to-face and non-face-to-face in the care of this patient, which includes all pre, intra, and post visit time on the date of service.  All documented time was specific to the E/M visit and does not include any procedures that may have been performed.    _______________________________________________________________________________    REASON FOR VISIT: F/U AS      HPI: Jason Wagner is a 47 y.o. male with prior history of chronic joint pain presenting for follow-up for  ankylosing spondylitis.  Clinical course has been complicated by intermittent follow-up and non-adherence to recommended therapies.      Last seen 10/30/2022 with Dr. Ilsa Iha    Currently on Humira 40 mg subcutaneous weekly     Interim History:     Humira?    PM and R?    AM stiffness?  Joints?  Swelling?    Fever?  Illness or infections?    Smoking?      Rheumatologic History:  Established care in 10/2019  at which time patient endorsed several year history of back pain in stiffness with recent worsening of symptoms in upper thoracic and cervical spine.  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 Noted to have severely limited Schober's and Occiput to Wall at that time. Lab work notable for mildly elevated ESR and CRP.  HLAB27 +.   ID Screen negative Quant gold, HIV, HCV and HBV serologies. Imaging notable for fusion of b/l sacroiliac joints, L1-L4 facet ankylosis. 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  Based on his history and presentation, high suspicion for ankylosing spondylitis and recommended trial of scheduled NSAID (meloxicam) but remains symptomatic.   Recommended patient start on anti-TNF (Humira) in 12/2019 but this was not started due to prolonged dental infection.  He ultimately started in 06/2021. In 01/2022 he was recommend to increase his Humira to weekly dosing.     Record Review: Available records were reviewed, including pertinent office visits, labs, and imaging.      REVIEW OF SYSTEMS: Ten system were reviewed and negative except as noted above.    Past Medical History:   Diagnosis Date    Arthritis     Waiting on test results from Rheumotologist appointment 2/17  Brain concussion 1987    First of many    Headache 2019-2020    Starting getting them after my neck got really bad.         Current Outpatient Medications   Medication Sig Dispense Refill    acetaminophen (TYLENOL) 500 MG tablet Take 1 tablet (500 mg total) by mouth every six (6) hours as needed for pain.      empty container (SHARPS-A-GATOR DISPOSAL SYSTEM) Misc Use as directed for sharps disposal 1 each 2    empty container Misc Use as directed 1 each 2    HUMIRA PEN CITRATE FREE 40 MG/0.4 ML Inject the contents of 1 pen (40 mg total) under the skin every seven (7) days. 12 each 3    ibuprofen (MOTRIN) 800 MG tablet Take 1 tablet (800 mg total) by mouth two (2) times a day as needed for pain. 180 tablet 0     No current facility-administered medications for this visit.     Allergies:  No Known Allergies    Surgical History: Unchanged    Social History     Socioeconomic History    Marital status: Unknown   Tobacco Use    Smoking status: Every Day     Current packs/day: 1.00     Average packs/day: 1 pack/day for 35.8 years (35.8 ttl pk-yrs)     Types: Cigarettes     Start date: 06/23/1987    Smokeless tobacco: Never   Substance and Sexual Activity    Alcohol use: Yes     Comment: 3-4 times a year    Drug use: Never    Sexual activity: Yes     Partners: Female     Comment: Hysterectomy   Other Topics Concern    Exercise Yes     Comment: Work    Living Situation Yes     Comment: Love with my family/ wife and 2/6 children     Family History   Problem Relation Age of Onset    Cancer Maternal Grandfather         Brought on by diabetes    Diabetes Maternal Grandfather         State 2 diabetes insulin dependent    Cancer Mother         Lung and breast cancer    Cancer Maternal Grandmother         Leukemia         Objective   There were no vitals filed for this visit.          Physical Exam:***  General:   Patient is well appearing, and does not appear to be in any acute distress   Eyes:   EOMI, and sclera anicteric.    Cardiovascular:  normal rate, regular rhythm.  S1 and S2 normal, without any murmur, rub, or gallop.   Pulmonary:  Clear to auscultation bilaterally, without                           Wheezes/crackles/rhonchi.  Good air movement. Normal work of breathing.   Skin:    No rash/lesions/breakdown. Normal turgor and elasticity.    No Raynaud???s phenomenon or digital ulcers.  No purpura, petechiae, telangiectasia or livedo reticularis.   Psychiatry:   Alert and oriented to person, place, and time. Mood and affect appropriate and congruent.   Extremities:   Warm and well perfused. No cyanosis, clubbing    Musculo Skeletal:  Tenderness in neck/shoulders, muscle spasm of trapezius on right. Limited range of motion in neck, but otherwise full range of motion in elbow, hip, knee, ankle, hands and feet. B/L shoulders w/ dec ROM over head.Tender to midline low back.    Neurological:  Alert and oriented to person, place and time.  Cranial nerves II-XII grossly intact.  Strength good bilaterally     05/21/22  Occiput to wall: 9.5cm  Modified schober's: 15cm --> 17cm    08/29/2021   Occiput to Wall:  10.5 cm   Chest expansion:  4.5 cm   Schober's:  15 cm --> 17 cm     Labs/Imaging/Other:  Lab Results   Component Value Date    WBC 10.4 10/30/2022    HGB 15.1 10/30/2022    HCT 43.4 10/30/2022    PLT 190 10/30/2022       Lab Results   Component Value Date    NA 141 08/29/2021    K 4.1 08/29/2021    CL 105 08/29/2021    CO2 27.7 08/29/2021    BUN 15 10/30/2022    CREATININE 0.79 10/30/2022    GLU 85 08/29/2021    CALCIUM 9.6 08/29/2021       Lab Results   Component Value Date    BILITOT 0.6 08/29/2021    PROT 7.2 08/29/2021    ALBUMIN 4.2 10/30/2022    ALT 16 10/30/2022    AST 19 10/30/2022    ALKPHOS 78 08/29/2021       No results found for: PT, INR, APTT

## 2023-03-31 ENCOUNTER — Ambulatory Visit: Admit: 2023-03-31 | Payer: MEDICARE | Attending: Family | Primary: Family

## 2023-04-15 NOTE — Unmapped (Unsigned)
[facility] Refill Coordination Note    Specialty Medication(s) to be Shipped:   Inflammatory Disorders: Humira    Other medication(s) to be shipped: No additional medications requested for fill at this time     Jason Wagner, DOB: 09/12/1976  Phone: [phone] (home)       All above HIPAA information was verified with patient.     Was a Nurse, learning disability used for this call? No    Completed refill call assessment today to schedule patient's medication shipment from the [facility] [phone]).  All relevant notes have been reviewed.     Specialty medication(s) and dose(s) confirmed: Regimen is correct and unchanged.   Changes to medications: Jason Wagner reports no changes at this time.  Changes to insurance: No  New side effects reported not previously addressed with a pharmacist or physician: {[id number]}  Questions for the pharmacist: No    Confirmed patient received a Conservation officer, historic buildings and a Surveyor, mining with first shipment. The patient will receive a drug information handout for each medication shipped and additional FDA Medication Guides as required.       DISEASE/MEDICATION-SPECIFIC INFORMATION        For patients on injectable medications: Patient currently has 1 doses left.  Next injection is scheduled for 04/22/2023.    SPECIALTY MEDICATION ADHERENCE     Medication Adherence    Patient reported X missed doses in the last month: 0  Specialty Medication: HUMIRA(CF) PEN 40 mg/0.4 mL injection (adalimumab)  Patient is on additional specialty medications: No  Informant: patient  Confirmed plan for next specialty medication refill: delivery by pharmacy  Refills needed for supportive medications: not needed          Refill Coordination    Has the Patients' Contact Information Changed: No  Is the Shipping Address Different: No         Were doses missed due to medication being on hold? No    Humira (CF) Pen 40 mg  /0.4 mL : 7 days of medicine on hand REFERRAL TO PHARMACIST     Referral to the pharmacist: Not needed      [facility]     Shipping address confirmed in Epic.       Delivery Scheduled: Yes, Expected medication delivery date: 04/24/2023.     Medication will be delivered via UPS to the prescription address in Epic WAM.    Jason Wagner Pharmacy Candidate  [facility] Specialty

## 2023-04-23 MED FILL — HUMIRA PEN CITRATE FREE 40 MG/0.4 ML: SUBCUTANEOUS | 28 days supply | Qty: 4 | Fill #2

## 2023-05-14 NOTE — Unmapped (Signed)
[facility] Refill Coordination Note    Specialty Medication(s) to be Shipped:   Inflammatory Disorders: Humira    Other medication(s) to be shipped: No additional medications requested for fill at this time     Jason Wagner, DOB: 1975/11/27  Phone: [phone] (home)       All above HIPAA information was verified with patient.     Was a Nurse, learning disability used for this call? No    Completed refill call assessment today to schedule patient's medication shipment from the [facility] [phone]).  All relevant notes have been reviewed.     Specialty medication(s) and dose(s) confirmed: Regimen is correct and unchanged.   Changes to medications: Man reports starting the following medications: bupropion  Changes to insurance: No  New side effects reported not previously addressed with a pharmacist or physician: None reported  Questions for the pharmacist: No    Confirmed patient received a Conservation officer, historic buildings and a Surveyor, mining with first shipment. The patient will receive a drug information handout for each medication shipped and additional FDA Medication Guides as required.       DISEASE/MEDICATION-SPECIFIC INFORMATION        For patients on injectable medications: Patient currently has 1 doses left.  Next injection is scheduled for 05/17/23.    SPECIALTY MEDICATION ADHERENCE     Medication Adherence    Patient reported X missed doses in the last month: 0  Specialty Medication: HUMIRA(CF) PEN 40 mg/0.4 mL injection (adalimumab)  Patient is on additional specialty medications: No  Informant: patient              Were doses missed due to medication being on hold? No     HUMIRA(CF) PEN 40 mg/0.4 mL injection (adalimumab): 7 days of medicine on hand     REFERRAL TO PHARMACIST     Referral to the pharmacist: Not needed      [facility]     Shipping address confirmed in Epic.       Delivery Scheduled: Yes, Expected medication delivery date: 05/19/23.     Medication will be delivered via UPS to the prescription address in Epic WAM.    Craige Cotta   [facility] Shared [facility] Specialty Technician

## 2023-05-18 MED FILL — HUMIRA PEN CITRATE FREE 40 MG/0.4 ML: SUBCUTANEOUS | 28 days supply | Qty: 4 | Fill #3

## 2023-06-12 NOTE — Unmapped (Signed)
[facility] Specialty and Home Delivery Pharmacy Refill Coordination Note    Specialty Medication(s) to be Shipped:   Inflammatory Disorders: Humira    Other medication(s) to be shipped: No additional medications requested for fill at this time     Jason Wagner, DOB: Jul 06, 1976  Phone: [phone] (home)       All above HIPAA information was verified with patient.     Was a Nurse, learning disability used for this call? No    Completed refill call assessment today to schedule patient's medication shipment from the [facility] Delivery [facility]  [phone]).  All relevant notes have been reviewed.     Specialty medication(s) and dose(s) confirmed: Regimen is correct and unchanged.   Changes to medications: Jason Wagner reports no changes at this time.  Changes to insurance: No  New side effects reported not previously addressed with a pharmacist or physician: None reported  Questions for the pharmacist: No    Confirmed patient received a Conservation officer, historic buildings and a Surveyor, mining with first shipment. The patient will receive a drug information handout for each medication shipped and additional FDA Medication Guides as required.       DISEASE/MEDICATION-SPECIFIC INFORMATION        For patients on injectable medications: Patient currently has 1 doses left.  Next injection is scheduled for 06/17/23.    SPECIALTY MEDICATION ADHERENCE     Medication Adherence    Patient reported X missed doses in the last month: 0  Specialty Medication: HUMIRA(CF) PEN 40 mg/0.4 mL injection (adalimumab)  Informant: patient              Were doses missed due to medication being on hold? No     HUMIRA(CF) PEN 40 mg/0.4 mL injection (adalimumab): 1 doses of medicine on hand       REFERRAL TO PHARMACIST     Referral to the pharmacist: Not needed      [facility]     Shipping address confirmed in Epic.       Delivery Scheduled: Yes, Expected medication delivery date: 06/17/23.     Medication will be delivered via UPS to the prescription address in Epic WAM.    Craige Cotta   [facility] Specialty and Home Delivery Pharmacy  Specialty Technician

## 2023-06-16 MED FILL — HUMIRA PEN CITRATE FREE 40 MG/0.4 ML: SUBCUTANEOUS | 28 days supply | Qty: 4 | Fill #4

## 2023-07-07 NOTE — Unmapped (Signed)
[facility] Specialty and Home Delivery Pharmacy Refill Coordination Note    Specialty Medication(s) to be Shipped:   Inflammatory Disorders: Humira    Other medication(s) to be shipped: No additional medications requested for fill at this time     Jason Wagner, DOB: 1975/10/22  Phone: [phone] (home)       All above HIPAA information was verified with patient.     Was a Nurse, learning disability used for this call? No    Completed refill call assessment today to schedule patient's medication shipment from the [facility] and [facility] Delivery Pharm[facility]  [phone]).  All relevant notes have been reviewed.     Specialty medication(s) and dose(s) confirmed: Regimen is correct and unchanged.   Changes to medications: Syere reports no changes at this time.  Changes to insurance: No  New side effects reported not previously addressed with a pharmacist or physician: None reported  Questions for the pharmacist: No    Confirmed patient received a Conservation officer, historic buildings and a Surveyor, mining with first shipment. The patient will receive a drug information handout for each medication shipped and additional FDA Medication Guides as required.       DISEASE/MEDICATION-SPECIFIC INFORMATION        For patients on injectable medications: Patient currently has 1 doses left.  Next injection is scheduled for 07/08/23.    SPECIALTY MEDICATION ADHERENCE     Medication Adherence    Patient reported X missed doses in the last month: 0  Specialty Medication: HUMIRA(CF) PEN 40 mg/0.4 mL injection (adalimumab)  Patient is on additional specialty medications: No  Informant: patient              Were doses missed due to medication being on hold? No     HUMIRA(CF) PEN 40 mg/0.4 mL injection (adalimumab): 1 doses of medicine on hand     REFERRAL TO PHARMACIST     Referral to the pharmacist: Not needed      [facility]     Shipping address confirmed in Epic.       Delivery Scheduled: Yes, Expected medication delivery date: 07/14/23.     Medication will be delivered via UPS to the prescription address in Epic WAM.    Craige Cotta   [facility] Specialty and Home Delivery Pharmacy  Specialty Technician

## 2023-07-13 MED FILL — HUMIRA PEN CITRATE FREE 40 MG/0.4 ML: SUBCUTANEOUS | 28 days supply | Qty: 4 | Fill #5

## 2023-08-07 NOTE — Unmapped (Signed)
[facility] Specialty and Home Delivery Pharmacy Refill Coordination Note    Specialty Medication(s) to be Shipped:   HUMIRA(CF) PEN 40 mg/0.4 mL injection (adalimumab)    Other medication(s) to be shipped: No additional medications requested for fill at this time     Jason Wagner, DOB: 1976-08-25  Phone: [phone] (home)       All above HIPAA information was verified with patient.     Was a Nurse, learning disability used for this call? No    Completed refill call assessment today to schedule patient's medication shipment from the [facility] Delivery Pharm[facility]  [phone]).  All relevant notes have been reviewed.     Specialty medication(s) and dose(s) confirmed: Regimen is correct and unchanged.   Changes to medications: Shoua reports no changes at this time.  Changes to insurance: No  New side effects reported not previously addressed with a pharmacist or physician: None reported  Questions for the pharmacist: No    Confirmed patient received a Conservation officer, historic buildings and a Surveyor, mining with first shipment. The patient will receive a drug information handout for each medication shipped and additional FDA Medication Guides as required.       DISEASE/MEDICATION-SPECIFIC INFORMATION        For patients on injectable medications: Patient currently has 1 doses left.  Next injection is scheduled for 08/12/2023.    SPECIALTY MEDICATION ADHERENCE     Medication Adherence    Patient reported X missed doses in the last month: 0  Specialty Medication: HUMIRA(CF) PEN 40 mg/0.4 mL injection (adalimumab)  Patient is on additional specialty medications: No              Were doses missed due to medication being on hold? No    HUMIRA(CF) PEN 40 mg/0.4 mL injection (adalimumab): 1 doses of medicine on hand    REFERRAL TO PHARMACIST     Referral to the pharmacist: Not needed      [facility]     Shipping address confirmed in Epic.       Delivery Scheduled: Yes, Expected medication delivery date: 08/14/2023.     Medication will be delivered via UPS to the prescription address in Epic WAM.    Dorisann Frames   [facility] Specialty and Home Delivery Pharmacy  Specialty Technician

## 2023-08-13 MED FILL — HUMIRA PEN CITRATE FREE 40 MG/0.4 ML: SUBCUTANEOUS | 28 days supply | Qty: 4 | Fill #6

## 2023-09-14 NOTE — Unmapped (Addendum)
[facility] has made a second and final attempt to reach this patient to refill the following medication:HUMIRA(CF) PEN 40 mg/0.4 mL injection (adalimumab).      We have left voicemails on the following phone numbers: [phone], have sent a MyChart message, and have sent a text message to the following phone numbers: [phone] .    Dates contacted: 12/17 - 12/23  Last scheduled delivery: 08/13/23    The patient may be at risk of non-compliance with this medication. The patient should call the [facility] at [phone]  Option 4, then Option 2: Dermatology, Gastroenterology, Rheumatology to refill medication.    Art therapist

## 2023-09-30 DIAGNOSIS — M459 Ankylosing spondylitis of unspecified sites in spine: Principal | ICD-10-CM

## 2023-10-01 NOTE — Unmapped (Signed)
[facility] - Enhanced Care Program  Reason for Call: Pharmacy and Medication Access; Type: SSC Refill Call  Referral Request: Rheumatology - Sheliah Mends, CPP    Summary of Telephone Encounter  [facility] pharmacy has made multiple attempts to reach pt to coordinate refill of Humira Pen 40mg /0.4 mL injection.   I called pt at phone # [phone] but it went straight to a message stating VM has not been set up.   I sent pt a MY CHART message.   Follow-Up:  Continue to reach out to pt to assist with refill coordination of Humira.     Call Attempt #: 1  Time Spent on Referral: 10 minutes  Number of Days Spent on Referral: 1    Aldean Ast, [facility]   Pharmacy Department  [facility]  [address]   [place], [place] [address]  [phone]

## 2023-10-06 NOTE — Unmapped (Signed)
[facility] - Enhanced Care Program  Reason for Call: Pharmacy and Medication Access; Type: SSC Refill Call  Referral Request: Rheumatology - Sheliah Mends, CPP    Summary of Telephone Encounter  [facility] pharmacy has made multiple attempts to reach pt to coordinate refill of Humira Pen 40mg /0.4 mL injection.   I called pt at phone # [phone] but it went straight to a message stating No routes found.   I tried calling Josue Hector (Domestic Partner) [phone] and left a VM to have Christin Fudge please call [facility] pharmacy to schedule his refill. Contact number provided [phone] option 4 then option 2.   There has been no response to [facility] CHART message.   Follow-Up:  Continue to reach out to pt to assist with refill coordination of Humira.     Call Attempt #: 2  Time Spent on Referral: 15 minutes  Number of Days Spent on Referral: 2    Vertell Limber RN, BSN  [facility]ursing [facility]   Pharmacy Department  [facility]  [address]   [place], [place] [address]  [phone]

## 2023-10-07 NOTE — Unmapped (Unsigned)
[facility] Rheumatology Clinic Visit    ASSESSMENT/PLAN:    48 y.o. male smoker with prior history of chronic joint pain presenting for follow-up for ankylosing spondylitis.      Last seen 04/2022     Currently on Humira 40 mg subcutaneous weekly (started 06/2021; increased to weekly 01/2022). Today he reports overall improvement but continues to have c-spine and l-spine pain consistent with mechanical etiology. Reassuringly occiput to wall and Schober's stable with stability in updated imaging.  Recommend referral to PM&R to assist with management of non-inflammatory pain symptoms. He would benefit from PT as well.     Ankylosing Spondylitis:   - Update monitoring labs: CBCw/diff, Crt, AST, ALT, ESR, CRP  - Continue Humira 40mg  [place] weekly.   - Ibuprofen 800mg  BID PRN. Instructed to take with food. Pt unable to tolerated indomethacin or meloxicam previously.     Tobacco use - currently smoking 1/2pk/dy; congratulated pt on decrease but advised cessation.     Health Maintenance  - Recommend yearly flu, COVID19 and PCV 20 vaccines and discussed rationale behind my recommendations; patient declines     I personally spent 35 minutes face-to-face and non-face-to-face in the care of this patient, which includes all pre, intra, and post visit time on the date of service.  All documented time was specific to the E/M visit and does not include any procedures that may have been performed.    RTC in 6 months with Jason Wagner and 12 months with myself     _______________________________________________________________________________    REASON FOR VISIT: F/U AS      HPI: Jason Wagner is a 48 y.o. male with prior history of chronic joint pain presenting for follow-up for  ankylosing spondylitis.  Clinical course has been complicated by intermittent follow-up and non-adherence to recommended therapies.      Last seen 04/2022 with Jason Wagner    Currently on Humira 40 mg subcutaneous weekly     Interim History:         Rheumatologic History:  Established care in 10/2019  at which time patient endorsed several year history of back pain in stiffness with recent worsening of symptoms in upper thoracic and cervical spine.  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 Noted to have severely limited Schober's and Occiput to Wall at that time. Lab work notable for mildly elevated ESR and CRP.  HLAB27 +.   ID Screen negative Quant gold, HIV, HCV and HBV serologies. Imaging notable for fusion of b/l sacroiliac joints, L1-L4 facet ankylosis. 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  Based on his history and presentation, high suspicion for ankylosing spondylitis and recommended trial of scheduled NSAID (meloxicam) but remains symptomatic.   Recommended patient start on anti-TNF (Humira) in 12/2019 but this was not started due to prolonged dental infection.  He ultimately started in 06/2021. In 01/2022 he was recommend to increase his Humira to weekly dosing.     Record Review: Available records were reviewed, including pertinent office visits, labs, and imaging.      REVIEW OF SYSTEMS: Ten system were reviewed and negative except as noted above.    Past Medical History:   Diagnosis Date    Arthritis     Waiting on test results from Rheumotologist appointment 2/17    Brain concussion 1987    First of many    Headache 2019-2020    Starting getting them after my neck got really bad.  Current Outpatient Medications   Medication Sig Dispense Refill    acetaminophen (TYLENOL) 500 MG tablet Take 1 tablet (500 mg total) by mouth every six (6) hours as needed for pain.      empty container (SHARPS-A-GATOR DISPOSAL SYSTEM) Misc Use as directed for sharps disposal 1 each 2    empty container Misc Use as directed 1 each 2    HUMIRA PEN CITRATE FREE 40 MG/0.4 ML Inject the contents of 1 pen (40 mg total) under the skin every seven (7) days. 12 each 3     No current facility-administered medications for this visit.     Allergies:  No Known Allergies    Surgical History: Unchanged    Social History     Socioeconomic History    Marital status: Unknown   Tobacco Use    Smoking status: Every Day     Current packs/day: 1.00     Average packs/day: 1 pack/day for 36.3 years (36.3 ttl pk-yrs)     Types: Cigarettes     Start date: 06/23/1987    Smokeless tobacco: Never   Substance and Sexual Activity    Alcohol use: Yes     Comment: 3-4 times a year    Drug use: Never    Sexual activity: Yes     Partners: Female     Comment: Hysterectomy   Other Topics Concern    Exercise Yes     Comment: Work    Living Situation Yes     Comment: Love with my family/ wife and 2/6 children     Family History   Problem Relation Age of Onset    Cancer Maternal Grandfather         Brought on by diabetes    Diabetes Maternal Grandfather         State 2 diabetes insulin dependent    Cancer Mother         Lung and breast cancer    Cancer Maternal Grandmother         Leukemia         Objective   There were no vitals filed for this visit.          Physical Exam:  General:   Patient is well appearing, and does not appear to be in any acute distress   Eyes:   EOMI, and sclera anicteric.    Cardiovascular:  normal rate, regular rhythm.  S1 and S2 normal, without any murmur, rub, or gallop.   Pulmonary:  Clear to auscultation bilaterally, without                           Wheezes/crackles/rhonchi.  Good air movement. Normal work of breathing.   Skin:    No rash/lesions/breakdown. Normal turgor and elasticity.    No Raynaud???s phenomenon or digital ulcers.  No purpura, petechiae, telangiectasia or livedo reticularis.   Psychiatry:   Alert and oriented to person, place, and time. Mood and affect appropriate and congruent.   Extremities:   Warm and well perfused. No cyanosis, clubbing    Musculo Skeletal:   Tenderness in neck/shoulders, muscle spasm of trapezius on right. Limited range of motion in neck, but otherwise full range of motion in elbow, hip, knee, ankle, hands and feet. B/L shoulders w/ dec ROM over head.Tender to midline low back.    Neurological:  Alert and oriented to person, place and time.  Cranial nerves II-XII grossly intact.  Strength good  bilaterally     05/21/22  Occiput to wall: 9.5cm  Modified schober's: 15cm --> 17cm    08/29/2021   Occiput to Wall:  10.5 cm   Chest expansion:  4.5 cm   Schober's:  15 cm --> 17 cm     Labs/Imaging/Other:  Lab Results   Component Value Date    WBC 10.4 10/30/2022    HGB 15.1 10/30/2022    HCT 43.4 10/30/2022    PLT 190 10/30/2022       Lab Results   Component Value Date    NA 141 08/29/2021    K 4.1 08/29/2021    CL 105 08/29/2021    CO2 27.7 08/29/2021    BUN 15 10/30/2022    CREATININE 0.79 10/30/2022    GLU 85 08/29/2021    CALCIUM 9.6 08/29/2021       Lab Results   Component Value Date    BILITOT 0.6 08/29/2021    PROT 7.2 08/29/2021    ALBUMIN 4.2 10/30/2022    ALT 16 10/30/2022    AST 19 10/30/2022    ALKPHOS 78 08/29/2021       No results found for: PT, INR, APTT

## 2023-10-08 NOTE — Unmapped (Signed)
[facility] - Enhanced Care Program  Reason for Call: Pharmacy and Medication Access; Type: SSC Refill Call  Referral Request: Rheumatology - Sheliah Mends, CPP    Summary of Telephone Encounter  [facility] pharmacy has made multiple attempts to reach pt to coordinate refill of Humira Pen 40mg /0.4 mL injection.   I called pt at phone # [phone] but it went straight to a message stating no voice mail has been set up.   I called Josue Hector (Domestic Partner) [phone] and was able to speak with her this morning.    Sunny Schlein will pass along the message. She tried to call him while I was on the phone but he did not answer and said when he is in the shop he can't hear his phone. But she will have him reach out to [facility] and if any barriers for medication she will have him give me a call back. Both numbers were provided.   Follow-Up:  Continue to reach out to pt to assist with refill coordination of Humira.     Call Attempt #: 3  Time Spent on Referral: 25 minutes  Number of Days Spent on Referral: 3    Vertell Limber RN, [facility]   Pharmacy Department  [facility]  [address]   [place], [place] [address]  [phone]

## 2023-10-08 NOTE — Unmapped (Signed)
[facility] - Enhanced Care Program  Reason for Call: Pharmacy and Medication Access; Type: SSC Refill Call  Referral Request: Rheumatology - Sheliah Mends, CPP     Summary of Telephone Encounter  I received a call back from pt regarding Humira refill from [facility].   Pt stated he has 2 Humira Pens left and has missed 2 doses because he ws sick and was instruced by Provider not to take if he is sick.   Pt is due for his next injection tomorrow. Pt denies any side effects because he was told by Provide that weight gain would not be a side effect of Humira. Pt denies any new allergies. No medication changes in the past 30 days. No insurance changes. I confirmed pt's address for shipping is correct on EPIC.   I informed pt that [facility] will no longer ship medication unless a credit card is on file and payment is made before sending medication. Pt stated they have always billed him before. I let him know this is a new policy that was effective in December 2024. Pt stated he does not have $4 in his account at this time and he will reach out to me when he is able to afford his medication.   I will inform Sheliah Mends, CPP.   Follow-Up:  Continue to reach out to pt to assist with refill coordination of Humira.      Call Attempt #: 4  Time Spent on Referral: 35 minutes  Number of Days Spent on Referral: 4     Vertell Limber RN, [facility]   Pharmacy Department  [facility]  [address]   [place], [place] [address]  [phone]

## 2023-10-20 NOTE — Unmapped (Signed)
[facility] - Enhanced Care Program  Reason for Call: Pharmacy and Medication Access; Type: SSC Refill Call  Referral Request: Rheumatology - Sheliah Mends, CPP     Summary of Telephone Encounter  I called pt at phone # [phone] to assist with refill coordination of Humira at [facility].   I had spoken with pt on 10/08/23 but pt was  not able to afford copay at that time. I let him know I would call him back in another week to assist with refill coordination.  I was not able to reach pt or leave a VM because his mailbox has not been set up.   I sent pt a text message and provided the phone number to [facility] to schedule refill to prevent any gaps in treatment.   Follow-Up:  Continue to reach out to pt to assist with refill coordination of Humira.      Call Attempt #: 5  Time Spent on Referral: 45 minutes  Number of Days Spent on Referral: 5     Vertell Limber RN, [facility]   Pharmacy Department  [facility]  [address]   [place], [place] [address]  [phone]

## 2023-10-27 NOTE — Unmapped (Signed)
[facility] - Enhanced Care Program  Reason for Call: Pharmacy and Medication Access; Type: SSC Refill Call  Referral Request: Rheumatology - Sheliah Mends, CPP     Summary of Telephone Encounter  I made another attempt to reach pt at phone # [phone] to assist with refill coordination of Humira at [facility]. I could not reach pt and no option to leave VM because mailbox has not been set up.   I sent pt at text reminder to please call [facility] to schedule his refill. I provided the phone number to [facility].   Follow-Up:  Referral will be closed due to the max call attempts.   Sheliah Mends, CPP will be notified.      Call Attempt #: 6  Time Spent on Referral: 45 minutes  Number of Days Spent on Referral: 6     Vertell Limber RN, [facility]   Pharmacy Department  [facility]  [address]   [place], [place] [address]  [phone]

## 2023-11-25 DIAGNOSIS — M459 Ankylosing spondylitis of unspecified sites in spine: Principal | ICD-10-CM

## 2023-11-25 MED ORDER — IBUPROFEN 800 MG TABLET
ORAL_TABLET | 0 refills | 0.00 days
Start: 2023-11-25 — End: ?

## 2023-11-25 NOTE — Unmapped (Signed)
 Patient needs appt before refill.

## 2023-12-04 NOTE — Unmapped (Signed)
 Specialty Medication(s): HUMIRA    Mr.Biegel has been dis-enrolled from the [employer] [facility] specialty pharmacy services as a result of multiple unsuccessful outreach attempts by the pharmacy.    Additional information provided to the patient: last filled 08/13/23, no future Rheum appts scheduled at this time    Julianne Rice, PharmD  [facility] Special[facility] and [facility] Delivery Pharmacy Specialty Pharmacist

## 2024-03-01 ENCOUNTER — Ambulatory Visit: Admit: 2024-03-01 | Discharge: 2024-03-01 | Payer: BLUE CROSS/BLUE SHIELD | Attending: Family | Primary: Family

## 2024-03-01 ENCOUNTER — Ambulatory Visit: Admit: 2024-03-01 | Discharge: 2024-03-01 | Payer: BLUE CROSS/BLUE SHIELD

## 2024-03-01 DIAGNOSIS — Z79899 Other long term (current) drug therapy: Principal | ICD-10-CM

## 2024-03-01 DIAGNOSIS — Z119 Encounter for screening for infectious and parasitic diseases, unspecified: Principal | ICD-10-CM

## 2024-03-01 DIAGNOSIS — M459 Ankylosing spondylitis of unspecified sites in spine: Principal | ICD-10-CM

## 2024-03-01 MED ORDER — HUMIRA PEN CITRATE FREE 40 MG/0.4 ML
SUBCUTANEOUS | 3 refills | 84.00000 days | Status: CP
Start: 2024-03-01 — End: 2025-03-01

## 2024-03-01 MED ORDER — IBUPROFEN 800 MG TABLET
ORAL_TABLET | Freq: Two times a day (BID) | ORAL | 0 refills | 90.00000 days | Status: CP | PRN
Start: 2024-03-01 — End: ?

## 2024-03-03 DIAGNOSIS — M459 Ankylosing spondylitis of unspecified sites in spine: Principal | ICD-10-CM

## 2024-03-08 DIAGNOSIS — M459 Ankylosing spondylitis of unspecified sites in spine: Principal | ICD-10-CM

## 2024-03-08 MED ORDER — HUMIRA PEN CITRATE FREE 40 MG/0.4 ML
SUBCUTANEOUS | 3 refills | 84.00000 days | Status: CP
Start: 2024-03-08 — End: 2025-03-08

## 2024-03-08 NOTE — Unmapped (Signed)
 Specialty Medication(s): Humira    Jason Wagner has been dis-enrolled from the [employer] [facility] specialty pharmacy services as a result of a pharmacy change. The patient is now filling at [facility], [place] .    Additional information provided to the patient: patient requests RX to be sent to [facility] on [address] In [place], [place] for pickup as he does not have a card to put on file for payment here.     [name], PharmD  [facility] Special[facility] and Home Delivery Pharmacy Specialty Pharmacist

## 2024-03-08 NOTE — Unmapped (Signed)
 [facility] Specialty Medication Onboarding    Specialty Medication: Humira  Prior Authorization: Approved   Financial Assistance: No - copay  <$25  Final Copay/Day Supply: $4 / 28 days    Insurance Restrictions: Yes - max 1 month supply     Notes to Pharmacist:   Credit Card on File: no  Start Date on Rx:      The triage team has completed the benefits investigation and has determined that the patient is able to fill this medication at [facility]. Please contact the patient to complete the onboarding or follow up with the prescribing physician as needed.

## 2024-07-05 ENCOUNTER — Ambulatory Visit: Admit: 2024-07-05 | Payer: PRIVATE HEALTH INSURANCE | Attending: Family | Primary: Family
